# Patient Record
Sex: Male | Born: 1950 | Hispanic: No
Health system: Southern US, Community
[De-identification: ages and names within clinical notes are randomized; demographics above are authoritative.]

## PROBLEM LIST (undated history)

## (undated) DIAGNOSIS — D239 Other benign neoplasm of skin, unspecified: Secondary | ICD-10-CM

## (undated) DIAGNOSIS — C439 Malignant melanoma of skin, unspecified: Secondary | ICD-10-CM

## (undated) DIAGNOSIS — J302 Other seasonal allergic rhinitis: Secondary | ICD-10-CM

## (undated) DIAGNOSIS — M1990 Unspecified osteoarthritis, unspecified site: Secondary | ICD-10-CM

## (undated) DIAGNOSIS — G4730 Sleep apnea, unspecified: Secondary | ICD-10-CM

## (undated) DIAGNOSIS — D099 Carcinoma in situ, unspecified: Secondary | ICD-10-CM

## (undated) DIAGNOSIS — Z8489 Family history of other specified conditions: Secondary | ICD-10-CM

## (undated) DIAGNOSIS — C4492 Squamous cell carcinoma of skin, unspecified: Secondary | ICD-10-CM

## (undated) DIAGNOSIS — I10 Essential (primary) hypertension: Secondary | ICD-10-CM

## (undated) DIAGNOSIS — Z87442 Personal history of urinary calculi: Secondary | ICD-10-CM

## (undated) HISTORY — DX: Carcinoma in situ, unspecified: D09.9

---
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
## 2000-06-09 DIAGNOSIS — C439 Malignant melanoma of skin, unspecified: Secondary | ICD-10-CM

## 2000-06-09 HISTORY — DX: Malignant melanoma of skin, unspecified: C43.9

## 2003-06-10 HISTORY — PX: COLONOSCOPY: SHX174

## 2012-10-12 DIAGNOSIS — J309 Allergic rhinitis, unspecified: Secondary | ICD-10-CM | POA: Insufficient documentation

## 2015-01-09 ENCOUNTER — Telehealth: Payer: Self-pay | Admitting: Gastroenterology

## 2015-01-09 ENCOUNTER — Other Ambulatory Visit: Payer: Self-pay

## 2015-01-09 NOTE — Telephone Encounter (Signed)
Gastroenterology Pre-Procedure Review  Request Date: 02-23-2015 Requesting Physician: Dr.   PATIENT REVIEW QUESTIONS: The patient responded to the following health history questions as indicated:    1. Are you having any GI issues? no 2. Do you have a personal history of Polyps? yes (12 years ago) 3. Do you have a family history of Colon Cancer or Polyps? no 4. Diabetes Mellitus? no 5. Joint replacements in the past 12 months?no 6. Major health problems in the past 3 months?no 7. Any artificial heart valves, MVP, or defibrillator?no    MEDICATIONS & ALLERGIES:    Patient reports the following regarding taking any anticoagulation/antiplatelet therapy:   Plavix, Coumadin, Eliquis, Xarelto, Lovenox, Pradaxa, Brilinta, or Effient? no Aspirin? yes (Daily)  Patient confirms/reports the following medications:  *Daily Aspirin *Simvastatin *Losartan potassium No current outpatient prescriptions on file.   No current facility-administered medications for this visit.    Patient confirms/reports the following allergies:  Allergies not on file  No orders of the defined types were placed in this encounter.    AUTHORIZATION INFORMATION Primary Insurance: 1D#: Group #:  Secondary Insurance: 1D#: Group #:  SCHEDULE INFORMATION: Date: 02-23-2015 Time: Location:[facility]

## 2015-02-20 ENCOUNTER — Other Ambulatory Visit: Payer: Self-pay

## 2015-02-23 ENCOUNTER — Ambulatory Visit: Admission: RE | Admit: 2015-02-23 | Payer: Self-pay | Source: Ambulatory Visit | Admitting: Gastroenterology

## 2015-02-23 HISTORY — DX: Other seasonal allergic rhinitis: J30.2

## 2015-02-23 HISTORY — DX: Family history of other specified conditions: Z84.89

## 2015-02-23 HISTORY — DX: Sleep apnea, unspecified: G47.30

## 2015-02-23 HISTORY — DX: Unspecified osteoarthritis, unspecified site: M19.90

## 2015-02-23 SURGERY — COLONOSCOPY WITH PROPOFOL
Anesthesia: Choice

## 2015-02-27 ENCOUNTER — Encounter
Admission: RE | Disposition: A | Discharge status: Home / Self Care | Payer: Self-pay | Source: Ambulatory Visit | Attending: Gastroenterology

## 2015-02-27 ENCOUNTER — Ambulatory Visit
Admission: RE | Admit: 2015-02-27 | Discharge: 2015-02-27 | Disposition: A | Discharge status: Home / Self Care | Payer: 59 | Source: Ambulatory Visit | Attending: Gastroenterology | Admitting: Gastroenterology

## 2015-02-27 ENCOUNTER — Encounter: Payer: Self-pay | Admitting: Anesthesiology

## 2015-02-27 ENCOUNTER — Ambulatory Visit: Payer: 59 | Admitting: Anesthesiology

## 2015-02-27 DIAGNOSIS — K5730 Diverticulosis of large intestine without perforation or abscess without bleeding: Secondary | ICD-10-CM | POA: Insufficient documentation

## 2015-02-27 DIAGNOSIS — Z1211 Encounter for screening for malignant neoplasm of colon: Secondary | ICD-10-CM | POA: Insufficient documentation

## 2015-02-27 DIAGNOSIS — M179 Osteoarthritis of knee, unspecified: Secondary | ICD-10-CM | POA: Insufficient documentation

## 2015-02-27 DIAGNOSIS — G4730 Sleep apnea, unspecified: Secondary | ICD-10-CM | POA: Diagnosis not present

## 2015-02-27 DIAGNOSIS — K640 First degree hemorrhoids: Secondary | ICD-10-CM | POA: Diagnosis not present

## 2015-02-27 HISTORY — PX: COLONOSCOPY WITH PROPOFOL: SHX5780

## 2015-02-27 SURGERY — COLONOSCOPY WITH PROPOFOL
Anesthesia: General

## 2015-02-27 MED ORDER — SODIUM CHLORIDE 0.9 % IV SOLN
INTRAVENOUS | Status: DC
  Administered 2015-02-27: 1000 mL via INTRAVENOUS

## 2015-02-27 MED ORDER — PROPOFOL INFUSION 10 MG/ML OPTIME
INTRAVENOUS | Status: DC | PRN
  Administered 2015-02-27: 140 ug/kg/min via INTRAVENOUS

## 2015-02-27 MED ORDER — MIDAZOLAM HCL 2 MG/2ML IJ SOLN
INTRAMUSCULAR | Status: DC | PRN
  Administered 2015-02-27: 1 mg via INTRAVENOUS

## 2015-02-27 MED ORDER — LIDOCAINE HCL (CARDIAC) 20 MG/ML IV SOLN
INTRAVENOUS | Status: DC | PRN
  Administered 2015-02-27: 60 mg via INTRAVENOUS

## 2015-02-27 MED ORDER — PROPOFOL 10 MG/ML IV BOLUS
INTRAVENOUS | Status: DC | PRN
  Administered 2015-02-27: 50 mg via INTRAVENOUS

## 2015-02-27 MED ORDER — SODIUM CHLORIDE 0.9 % IV SOLN
INTRAVENOUS | Status: DC
  Administered 2015-02-27: 10:00:00 via INTRAVENOUS

## 2015-02-27 NOTE — Transfer of Care (Signed)
Immediate Anesthesia Transfer of Care Note  Patient: Carlos Carroll  Procedure(s) Performed: Procedure(s): COLONOSCOPY WITH PROPOFOL (N/A)  Patient Location: Endoscopy Unit  Anesthesia Type:General  Level of Consciousness: awake, alert , oriented and patient cooperative  Airway & Oxygen Therapy: Patient Spontanous Breathing and Patient connected to nasal cannula oxygen  Post-op Assessment: Report given to RN, Post -op Vital signs reviewed and stable and Patient moving all extremities X 4  Post vital signs: Reviewed and stable  Last Vitals:  Filed Vitals:   02/27/15 1050  BP: 144/81  Pulse: 94  Temp: 36.4 C  Resp: 23    Complications: No apparent anesthesia complications

## 2015-02-27 NOTE — Anesthesia Postprocedure Evaluation (Signed)
  Anesthesia Post-op Note  Patient: Carlos Carroll  Procedure(s) Performed: Procedure(s): COLONOSCOPY WITH PROPOFOL (N/A)  Anesthesia type:General  Patient location: PACU  Post pain: Pain level controlled  Post assessment: Post-op Vital signs reviewed, Patient's Cardiovascular Status Stable, Respiratory Function Stable, Patent Airway and No signs of Nausea or vomiting  Post vital signs: Reviewed and stable  Last Vitals:  Filed Vitals:   02/27/15 1120  BP: 155/94  Pulse: 81  Temp:   Resp: 16    Level of consciousness: awake, alert  and patient cooperative  Complications: No apparent anesthesia complications

## 2015-02-27 NOTE — Anesthesia Preprocedure Evaluation (Signed)
Anesthesia Evaluation  Patient identified by MRN, date of birth, ID band Patient awake    Reviewed: Allergy & Precautions, NPO status , Patient's Chart, lab work & pertinent test results, reviewed documented beta blocker date and time   Airway Mallampati: III  TM Distance: >3 FB     Dental  (+) Chipped   Pulmonary sleep apnea and Continuous Positive Airway Pressure Ventilation ,           Cardiovascular      Neuro/Psych    GI/Hepatic   Endo/Other  Morbid obesity  Renal/GU      Musculoskeletal  (+) Arthritis ,   Abdominal   Peds  Hematology   Anesthesia Other Findings   Reproductive/Obstetrics                             Anesthesia Physical Anesthesia Plan  ASA: III  Anesthesia Plan: General   Post-op Pain Management:    Induction:   Airway Management Planned: Nasal Cannula  Additional Equipment:   Intra-op Plan:   Post-operative Plan:   Informed Consent: I have reviewed the patients History and Physical, chart, labs and discussed the procedure including the risks, benefits and alternatives for the proposed anesthesia with the patient or authorized representative who has indicated his/her understanding and acceptance.     Plan Discussed with: CRNA  Anesthesia Plan Comments:         Anesthesia Quick Evaluation

## 2015-02-27 NOTE — Op Note (Signed)
[facility] Gastroenterology Patient Name: Carlos Carroll Procedure Date: 02/27/2015 10:05 AM MRN: [id number] Account #: [id number] Date of Birth: [birth year] Admit Type: Outpatient Age: 64 Room: [facility] ENDO ROOM 4 Gender: Male Note Status: Finalized Procedure:         Colonoscopy Indications:       Screening for colorectal malignant neoplasm Providers:         Lucilla Lame, MD Referring MD:      Gayland Curry, MD (Referring MD) Medicines:         Propofol per Anesthesia Complications:     No immediate complications. Procedure:         Pre-Anesthesia Assessment:                    - Prior to the procedure, a History and Physical was                     performed, and patient medications and allergies were                     reviewed. The patient's tolerance of previous anesthesia                     was also reviewed. The risks and benefits of the procedure                     and the sedation options and risks were discussed with the                     patient. All questions were answered, and informed consent                     was obtained. Prior Anticoagulants: The patient has taken                     no previous anticoagulant or antiplatelet agents. ASA                     Grade Assessment: II - A patient with mild systemic                     disease. After reviewing the risks and benefits, the                     patient was deemed in satisfactory condition to undergo                     the procedure.                    After obtaining informed consent, the colonoscope was                     passed under direct vision. Throughout the procedure, the                     patient's blood pressure, pulse, and oxygen saturations                     were monitored continuously. The Colonoscope was                     introduced through the anus and advanced to the the cecum,  identified by appendiceal orifice and ileocecal valve. The               colonoscopy was performed without difficulty. The patient                     tolerated the procedure well. The quality of the bowel                     preparation was excellent. Findings:      The perianal and digital rectal examinations were normal.      Multiple small-mouthed diverticula were found in the sigmoid colon.      Non-bleeding internal hemorrhoids were found during retroflexion. The       hemorrhoids were Grade I (internal hemorrhoids that do not prolapse). Impression:        - Diverticulosis in the sigmoid colon.                    - Non-bleeding internal hemorrhoids.                    - No specimens collected. Recommendation:    - Repeat colonoscopy in 10 years for screening unless any                     change in family history or lower GI problems. Procedure Code(s): --- Professional ---                    [phone], Colonoscopy, flexible; diagnostic, including                     collection of specimen(s) by brushing or washing, when                     performed (separate procedure) Diagnosis Code(s): --- Professional ---                    Z12.11, Encounter for screening for malignant neoplasm of                     colon                    K57.30, Diverticulosis of large intestine without                     perforation or abscess without bleeding CPT copyright 2014 American Medical Association. All rights reserved. The codes documented in this report are preliminary and upon coder review may  be revised to meet current compliance requirements. Lucilla Lame, MD 02/27/2015 10:44:04 AM This report has been signed electronically. Number of Addenda: 0 Note Initiated On: 02/27/2015 10:05 AM Scope Withdrawal Time: 0 hours 7 minutes 29 seconds  Total Procedure Duration: 0 hours 12 minutes 54 seconds       [facility]

## 2015-02-27 NOTE — H&P (Signed)
  [facility]  [address], [place], [place] [address] Phone: [phone] Fax : [phone]  Primary Care Physician:  Gayland Curry, MD Primary Gastroenterologist:  Dr. Allen Norris  Pre-Procedure History & Physical: HPI:  Carlos Carroll is a 64 y.o. male is here for a screening colonoscopy.   Past Medical History  Diagnosis Date  . Arthritis     KNEE JOINT  . Seasonal allergies   . Sleep apnea     CPAP  . Family history of adverse reaction to anesthesia     MOTHER LOST TASTE AFTER SURGERY    Past Surgical History  Procedure Laterality Date  . Colonoscopy  2005    Prior to Admission medications   Medication Sig Start Date End Date Taking? Authorizing Provider  aspirin 81 MG tablet Take 81 mg by mouth daily. AM   Yes Historical Provider, MD  Flaxseed, Linseed, (FLAX SEED OIL PO) Take 1,400 mg by mouth. AM   Yes Historical Provider, MD  Glucosamine-Chondroit-Vit C-Mn (GLUCOSAMINE 1500 COMPLEX PO) Take by mouth. AM   Yes Historical Provider, MD  losartan (COZAAR) 50 MG tablet Take 50 mg by mouth daily. AM   Yes Historical Provider, MD  Multiple Vitamins-Minerals (MULTIVITAMIN ADULT PO) Take by mouth. AM   Yes Historical Provider, MD  simvastatin (ZOCOR) 20 MG tablet Take 20 mg by mouth daily. AM   Yes Historical Provider, MD    Allergies as of 02/20/2015  . (No Known Allergies)    History reviewed. No pertinent family history.  Social History   Social History  . Marital Status: Married    Spouse Name: N/A  . Number of Children: N/A  . Years of Education: N/A   Occupational History  . Not on file.   Social History Main Topics  . Smoking status: Never Smoker   . Smokeless tobacco: Not on file  . Alcohol Use: No  . Drug Use: Not on file  . Sexual Activity: Not on file   Other Topics Concern  . Not on file   Social History Narrative    Review of Systems: See HPI, otherwise negative ROS  Physical Exam: BP 197/82 mmHg  Pulse 90   Temp(Src) 96.7 F (35.9 C) (Tympanic)  Resp 21  Ht 5\' 10"  (1.778 m)  Wt 375 lb (170.099 kg)  BMI 53.81 kg/m2  SpO2 98% General:   Alert,  pleasant and cooperative in NAD Head:  Normocephalic and atraumatic. Neck:  Supple; no masses or thyromegaly. Lungs:  Clear throughout to auscultation.    Heart:  Regular rate and rhythm. Abdomen:  Soft, nontender and nondistended. Normal bowel sounds, without guarding, and without rebound.   Neurologic:  Alert and  oriented x4;  grossly normal neurologically.  Impression/Plan: Carlos Carroll is now here to undergo a screening colonoscopy.  Risks, benefits, and alternatives regarding colonoscopy have been reviewed with the patient.  Questions have been answered.  All parties agreeable.

## 2015-02-28 ENCOUNTER — Encounter: Payer: Self-pay | Admitting: Gastroenterology

## 2016-03-26 DIAGNOSIS — Z6841 Body Mass Index (BMI) 40.0 and over, adult: Secondary | ICD-10-CM | POA: Insufficient documentation

## 2016-06-18 DIAGNOSIS — Z86018 Personal history of other benign neoplasm: Secondary | ICD-10-CM

## 2016-06-18 HISTORY — DX: Personal history of other benign neoplasm: Z86.018

## 2016-09-02 DIAGNOSIS — C4492 Squamous cell carcinoma of skin, unspecified: Secondary | ICD-10-CM

## 2016-09-02 HISTORY — DX: Squamous cell carcinoma of skin, unspecified: C44.92

## 2017-09-03 DIAGNOSIS — D239 Other benign neoplasm of skin, unspecified: Secondary | ICD-10-CM

## 2017-09-03 HISTORY — DX: Other benign neoplasm of skin, unspecified: D23.9

## 2017-11-06 ENCOUNTER — Ambulatory Visit (INDEPENDENT_AMBULATORY_CARE_PROVIDER_SITE_OTHER): Payer: Medicare PPO

## 2017-11-06 ENCOUNTER — Other Ambulatory Visit: Payer: Self-pay | Admitting: Podiatry

## 2017-11-06 ENCOUNTER — Ambulatory Visit: Payer: Medicare PPO | Admitting: Podiatry

## 2017-11-06 DIAGNOSIS — M659 Synovitis and tenosynovitis, unspecified: Secondary | ICD-10-CM

## 2017-11-06 DIAGNOSIS — M65971 Unspecified synovitis and tenosynovitis, right ankle and foot: Secondary | ICD-10-CM

## 2017-11-06 DIAGNOSIS — M779 Enthesopathy, unspecified: Secondary | ICD-10-CM

## 2017-11-06 MED ORDER — MELOXICAM 15 MG PO TABS: 15.0000 mg | ORAL_TABLET | Freq: Every day | ORAL | 3 refills | Status: DC

## 2017-11-09 NOTE — Progress Notes (Signed)
   Subjective:  67 year old male presenting today as a new patient with a chief complaint of intermittent sharp, stabbing pain of the medial right ankle that began about three months ago. Walking, bearing weight and elevating the foot increases the pain. He has been taking Tylenol and Ibuprofen for the pain with minimal relief. Patient is here for further evaluation and treatment.    Past Medical History:  Diagnosis Date  . Arthritis    KNEE JOINT  . Family history of adverse reaction to anesthesia    MOTHER LOST TASTE AFTER SURGERY  . Seasonal allergies   . Sleep apnea    CPAP       Objective / Physical Exam:  General:  The patient is alert and oriented x3 in no acute distress. Dermatology:  Skin is warm, dry and supple bilateral lower extremities. Negative for open lesions or macerations. Vascular:  Palpable pedal pulses bilaterally. No edema or erythema noted. Capillary refill within normal limits. Neurological:  Epicritic and protective threshold grossly intact bilaterally.  Musculoskeletal Exam:  Pain on palpation to the anterior lateral medial aspects of the patient's right ankle. Mild edema noted. Range of motion within normal limits to all pedal and ankle joints bilateral. Muscle strength 5/5 in all groups bilateral.   Radiographic Exam:  DJD noted to the posterior aspect of the ankle joint on the lateral view. No fracture/dislocation/boney destruction.    Assessment: 1. pain in right ankle 2. Synovitis/DJD of right ankle  Plan of Care:  1. Patient was evaluated. X-Rays reviewed.  2. injection of 0.5 mL Celestone Soluspan injected in the patient's right ankle. 3. prescription for Meloxicam provided to patient.  4. ankle brace dispensed 5. patient is to return to clinic in 4 weeks  Edrick Kins, DPM [facility]  Dr. Edrick Kins, [address]                                        [place], [place] [address]                Office  [phone]  Fax [phone]

## 2018-01-01 ENCOUNTER — Ambulatory Visit: Payer: Medicare PPO | Admitting: Podiatry

## 2018-01-01 ENCOUNTER — Encounter: Payer: Self-pay | Admitting: Podiatry

## 2018-01-01 DIAGNOSIS — M19071 Primary osteoarthritis, right ankle and foot: Secondary | ICD-10-CM

## 2018-01-01 DIAGNOSIS — M659 Synovitis and tenosynovitis, unspecified: Secondary | ICD-10-CM | POA: Diagnosis not present

## 2018-01-01 NOTE — Progress Notes (Signed)
   Subjective:  67 year old male presenting today for follow up evaluation of right ankle pain. He states his pain has not changed and the injection did not provide any relief. He reports taking Meloxicam sporadically with no significant relief but does admit that he does not take it daily as directed. Patient is here for further evaluation and treatment.    Past Medical History:  Diagnosis Date  . Arthritis    KNEE JOINT  . Family history of adverse reaction to anesthesia    MOTHER LOST TASTE AFTER SURGERY  . Seasonal allergies   . Sleep apnea    CPAP       Objective / Physical Exam:  General:  The patient is alert and oriented x3 in no acute distress. Dermatology:  Skin is warm, dry and supple bilateral lower extremities. Negative for open lesions or macerations. Vascular:  Palpable pedal pulses bilaterally. No edema or erythema noted. Capillary refill within normal limits. Neurological:  Epicritic and protective threshold grossly intact bilaterally.  Musculoskeletal Exam:  Pain on palpation to the anterior lateral medial aspects of the patient's right ankle. Mild edema noted. Pain with plantar flexion of the ankle joint. Range of motion within normal limits to all pedal and ankle joints bilateral. Muscle strength 5/5 in all groups bilateral.   Assessment: 1. pain in right ankle 2. Synovitis/DJD of right ankle  Plan of Care:  1. Patient was evaluated.   2. injection of 0.5 mL Celestone Soluspan injected into the posterior aspect of the ankle. 3. Continue taking Tylenol PM and OTC Motrin.  4. Continue using ankle brace.  5. Return to clinic as needed.   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  Edrick Kins, DPM [facility]  Dr. Edrick Kins, [address]                                        [place], [place] [address]                Office [phone]  Fax [phone]

## 2018-03-01 DIAGNOSIS — R899 Unspecified abnormal finding in specimens from other organs, systems and tissues: Secondary | ICD-10-CM | POA: Insufficient documentation

## 2018-05-10 DIAGNOSIS — M1711 Unilateral primary osteoarthritis, right knee: Secondary | ICD-10-CM | POA: Insufficient documentation

## 2018-06-22 ENCOUNTER — Other Ambulatory Visit: Payer: Self-pay | Admitting: Podiatry

## 2019-01-18 ENCOUNTER — Other Ambulatory Visit: Payer: Self-pay

## 2019-01-18 ENCOUNTER — Emergency Department: Payer: Medicare HMO

## 2019-01-18 ENCOUNTER — Emergency Department
Admission: EM | Admit: 2019-01-18 | Discharge: 2019-01-18 | Disposition: A | Discharge status: Home / Self Care | Payer: Medicare HMO | Attending: Student in an Organized Health Care Education/Training Program | Admitting: Student in an Organized Health Care Education/Training Program

## 2019-01-18 ENCOUNTER — Encounter: Payer: Self-pay | Admitting: Emergency Medicine

## 2019-01-18 DIAGNOSIS — Z7982 Long term (current) use of aspirin: Secondary | ICD-10-CM | POA: Insufficient documentation

## 2019-01-18 DIAGNOSIS — Z79899 Other long term (current) drug therapy: Secondary | ICD-10-CM | POA: Diagnosis not present

## 2019-01-18 DIAGNOSIS — N201 Calculus of ureter: Secondary | ICD-10-CM | POA: Insufficient documentation

## 2019-01-18 DIAGNOSIS — R109 Unspecified abdominal pain: Secondary | ICD-10-CM

## 2019-01-18 LAB — CBC WITH DIFFERENTIAL/PLATELET
Abs Immature Granulocytes: 0.11 10*3/uL — ABNORMAL HIGH (ref 0.00–0.07)
Basophils Absolute: 0.1 10*3/uL (ref 0.0–0.1)
Basophils Relative: 0 %
Eosinophils Absolute: 0.1 10*3/uL (ref 0.0–0.5)
Eosinophils Relative: 1 %
HCT: 50.9 % (ref 39.0–52.0)
Hemoglobin: 16.3 g/dL (ref 13.0–17.0)
Immature Granulocytes: 1 %
Lymphocytes Relative: 19 %
Lymphs Abs: 2.1 10*3/uL (ref 0.7–4.0)
MCH: 28.3 pg (ref 26.0–34.0)
MCHC: 32 g/dL (ref 30.0–36.0)
MCV: 88.5 fL (ref 80.0–100.0)
Monocytes Absolute: 0.8 10*3/uL (ref 0.1–1.0)
Monocytes Relative: 8 %
Neutro Abs: 8 10*3/uL — ABNORMAL HIGH (ref 1.7–7.7)
Neutrophils Relative %: 71 %
Platelets: 292 10*3/uL (ref 150–400)
RBC: 5.75 MIL/uL (ref 4.22–5.81)
RDW: 15.6 % — ABNORMAL HIGH (ref 11.5–15.5)
WBC: 11.2 10*3/uL — ABNORMAL HIGH (ref 4.0–10.5)
nRBC: 0 % (ref 0.0–0.2)

## 2019-01-18 LAB — URINALYSIS, COMPLETE (UACMP) WITH MICROSCOPIC
Bacteria, UA: NONE SEEN
Bilirubin Urine: NEGATIVE
Glucose, UA: NEGATIVE mg/dL
Ketones, ur: NEGATIVE mg/dL
Leukocytes,Ua: NEGATIVE
Nitrite: NEGATIVE
Protein, ur: NEGATIVE mg/dL
Specific Gravity, Urine: 1.024 (ref 1.005–1.030)
pH: 5 (ref 5.0–8.0)

## 2019-01-18 LAB — COMPREHENSIVE METABOLIC PANEL
ALT: 24 U/L (ref 0–44)
AST: 24 U/L (ref 15–41)
Albumin: 4.3 g/dL (ref 3.5–5.0)
Alkaline Phosphatase: 61 U/L (ref 38–126)
Anion gap: 11 (ref 5–15)
BUN: 22 mg/dL (ref 8–23)
CO2: 26 mmol/L (ref 22–32)
Calcium: 9.9 mg/dL (ref 8.9–10.3)
Chloride: 103 mmol/L (ref 98–111)
Creatinine, Ser: 0.86 mg/dL (ref 0.61–1.24)
GFR calc Af Amer: >60 mL/min (ref >60)
GFR calc non Af Amer: >60 mL/min (ref >60)
Glucose, Bld: 107 mg/dL — ABNORMAL HIGH (ref 70–99)
Potassium: 3.8 mmol/L (ref 3.5–5.1)
Sodium: 140 mmol/L (ref 135–145)
Total Bilirubin: 0.8 mg/dL (ref 0.3–1.2)
Total Protein: 7.4 g/dL (ref 6.5–8.1)

## 2019-01-18 MED ORDER — TAMSULOSIN HCL 0.4 MG PO CAPS: 0.4000 mg | ORAL_CAPSULE | Freq: Every day | ORAL | 0 refills | Status: DC

## 2019-01-18 MED ORDER — ONDANSETRON HCL 4 MG/2ML IJ SOLN
4.0000 mg | Freq: Once | INTRAMUSCULAR | Status: AC
  Administered 2019-01-18: 4 mg via INTRAVENOUS
  Filled 2019-01-18: qty 2

## 2019-01-18 MED ORDER — ONDANSETRON HCL 4 MG PO TABS: 4.0000 mg | ORAL_TABLET | Freq: Every day | ORAL | 0 refills | Status: DC | PRN

## 2019-01-18 MED ORDER — MORPHINE SULFATE (PF) 4 MG/ML IV SOLN
4.0000 mg | INTRAVENOUS | Status: DC | PRN
  Administered 2019-01-18: 4 mg via INTRAVENOUS
  Filled 2019-01-18: qty 1

## 2019-01-18 MED ORDER — HYDROCODONE-ACETAMINOPHEN 5-325 MG PO TABS: 1.0000 | ORAL_TABLET | ORAL | 0 refills | Status: DC | PRN

## 2019-01-18 NOTE — ED Notes (Signed)
Signature pad not working and pt refused to wait for this nurse to print copy for signature

## 2019-01-18 NOTE — ED Triage Notes (Signed)
Pt presents to ED via POV with c/o intermittent L flank pain x 3 days, pt states today pain since approx 0500. Pt states hematuria on Thursday and Friday, denies obviously blood in urine since then.

## 2019-01-18 NOTE — ED Notes (Signed)
Patient made aware he cannot drive after receiving IV morphine. Patient stated "I do not feel any different" Patient made aware he needs to call a ride and patient verbalized agreement. Patient calling ride in room at this time

## 2019-01-18 NOTE — ED Provider Notes (Signed)
[facility] Emergency Department Provider Note    First MD Initiated Contact with Patient 01/18/19 [phone]     (approximate)  I have reviewed the triage vital signs and the nursing notes.   HISTORY  Chief Complaint Flank Pain    HPI Carlos Carroll is a 68 y.o. male presents the ER for evaluation of acute  left leg pain.  States he had intermittent episodes of left flank pain as well as hematuria since the weekend.  Denies any history of kidney stones.  No fevers.  No nausea or vomiting.  Is having difficulty finding a comfortable spot.  Denies any trauma.  He does take an aspirin.  History of diverticulosis.  No diarrhea or constipation.   Past Medical History:  Diagnosis Date  . Arthritis    KNEE JOINT  . Family history of adverse reaction to anesthesia    MOTHER LOST TASTE AFTER SURGERY  . Seasonal allergies   . Sleep apnea    CPAP   No family history on file. Past Surgical History:  Procedure Laterality Date  . COLONOSCOPY  2005  . COLONOSCOPY WITH PROPOFOL N/A 02/27/2015   Procedure: COLONOSCOPY WITH PROPOFOL;  Surgeon: Lucilla Lame, MD;  Location: [facility] ENDOSCOPY;  Service: Endoscopy;  Laterality: N/A;   Patient Active Problem List   Diagnosis Date Noted  . Special screening for malignant neoplasms, colon   . Diverticulosis of large intestine without diverticulitis       Prior to Admission medications   Medication Sig Start Date End Date Taking? Authorizing Provider  aspirin 81 MG tablet Take 81 mg by mouth daily. AM    [provider]  atorvastatin (LIPITOR) 20 MG tablet  12/30/17   [provider]  atorvastatin (LIPITOR) 40 MG tablet Take 40 mg by mouth daily. 08/31/17   [provider]  doxycycline (VIBRAMYCIN) 100 MG capsule TAKE 1 CAPSULE BY MOUTH TWICE A DAY TAKE WITH FOOD AND DRINK 12/22/17   [provider]  Flaxseed, Linseed, (FLAX SEED OIL PO) Take 1,400 mg by mouth. AM    [provider]   Glucosamine-Chondroit-Vit C-Mn (GLUCOSAMINE 1500 COMPLEX PO) Take by mouth. AM    [provider]  hydrochlorothiazide (HYDRODIURIL) 25 MG tablet Take by mouth. 12/29/16 12/29/17  [provider]  HYDROcodone-acetaminophen (NORCO) 5-325 MG tablet Take 1 tablet by mouth every 4 (four) hours as needed for moderate pain. 01/18/19   Merlyn Lot, MD  losartan (COZAAR) 50 MG tablet Take 50 mg by mouth daily. AM    [provider]  meloxicam (MOBIC) 15 MG tablet TAKE 1 TABLET BY MOUTH EVERY DAY 06/23/18   Edrick Kins, DPM  Multiple Vitamins-Minerals (MULTIVITAMIN ADULT PO) Take by mouth. AM    [provider]  ondansetron (ZOFRAN) 4 MG tablet Take 1 tablet (4 mg total) by mouth daily as needed. 01/18/19 01/18/20  Merlyn Lot, MD  tamsulosin (FLOMAX) 0.4 MG CAPS capsule Take 1 capsule (0.4 mg total) by mouth daily after supper. 01/18/19   Merlyn Lot, MD    Allergies Patient has no known allergies.    Social History Social History   Tobacco Use  . Smoking status: Never Smoker  . Smokeless tobacco: Never Used  Substance Use Topics  . Alcohol use: No  . Drug use: Not on file    Review of Systems Patient denies headaches, rhinorrhea, blurry vision, numbness, shortness of breath, chest pain, edema, cough, abdominal pain, nausea, vomiting, diarrhea, dysuria, fevers, rashes or hallucinations  unless otherwise stated above in HPI. ____________________________________________   PHYSICAL EXAM:  VITAL SIGNS: Vitals:   01/18/19 0727 01/18/19 0916  BP: (!) 162/67 (!) 144/99  Pulse: 84 79  Resp: 20 18  Temp: 97.9 F (36.6 C)   SpO2: 95% 94%    Constitutional: Alert and oriented.  Eyes: Conjunctivae are normal.  Head: Atraumatic. Nose: No congestion/rhinnorhea. Mouth/Throat: Mucous membranes are moist.   Neck: No stridor. Painless ROM.  Cardiovascular: Normal rate, regular rhythm. Grossly normal heart sounds.  Good peripheral circulation.  Respiratory: Normal respiratory effort.  No retractions. Lungs CTAB. Gastrointestinal: Soft and nontender. No distention. No abdominal bruits. + left CVA tenderness. Genitourinary: deferred Musculoskeletal: No lower extremity tenderness nor edema.  No joint effusions. Neurologic:  Normal speech and language. No gross focal neurologic deficits are appreciated. No facial droop Skin:  Skin is warm, dry and intact. No rash noted. Psychiatric: Mood and affect are normal. Speech and behavior are normal.  ____________________________________________   LABS (all labs ordered are listed, but only abnormal results are displayed)  Results for orders placed or performed during the hospital encounter of 01/18/19 (from the past 24 hour(s))  CBC with Differential/Platelet     Status: Abnormal   Collection Time: 01/18/19  7:51 AM  Result Value Ref Range   WBC 11.2 (H) 4.0 - 10.5 K/uL   RBC 5.75 4.22 - 5.81 MIL/uL   Hemoglobin 16.3 13.0 - 17.0 g/dL   HCT 50.9 39.0 - 52.0 %   MCV 88.5 80.0 - 100.0 fL   MCH 28.3 26.0 - 34.0 pg   MCHC 32.0 30.0 - 36.0 g/dL   RDW 15.6 (H) 11.5 - 15.5 %   Platelets 292 150 - 400 K/uL   nRBC 0.0 0.0 - 0.2 %   Neutrophils Relative % 71 %   Neutro Abs 8.0 (H) 1.7 - 7.7 K/uL   Lymphocytes Relative 19 %   Lymphs Abs 2.1 0.7 - 4.0 K/uL   Monocytes Relative 8 %   Monocytes Absolute 0.8 0.1 - 1.0 K/uL   Eosinophils Relative 1 %   Eosinophils Absolute 0.1 0.0 - 0.5 K/uL   Basophils Relative 0 %   Basophils Absolute 0.1 0.0 - 0.1 K/uL   Immature Granulocytes 1 %   Abs Immature Granulocytes 0.11 (H) 0.00 - 0.07 K/uL  Comprehensive metabolic panel     Status: Abnormal   Collection Time: 01/18/19  7:51 AM  Result Value Ref Range   Sodium 140 135 - 145 mmol/L   Potassium 3.8 3.5 - 5.1 mmol/L   Chloride 103 98 - 111 mmol/L   CO2 26 22 - 32 mmol/L   Glucose, Bld 107 (H) 70 - 99 mg/dL   BUN 22 8 - 23 mg/dL   Creatinine, Ser 0.86 0.61 - 1.24 mg/dL   Calcium 9.9 8.9 - 10.3  mg/dL   Total Protein 7.4 6.5 - 8.1 g/dL   Albumin 4.3 3.5 - 5.0 g/dL   AST 24 15 - 41 U/L   ALT 24 0 - 44 U/L   Alkaline Phosphatase 61 38 - 126 U/L   Total Bilirubin 0.8 0.3 - 1.2 mg/dL   GFR calc non Af Amer >60 >60 mL/min   GFR calc Af Amer >60 >60 mL/min   Anion gap 11 5 - 15  Urinalysis, Complete w Microscopic     Status: Abnormal   Collection Time: 01/18/19  7:51 AM  Result Value Ref Range   Color, Urine YELLOW (A) YELLOW   APPearance  HAZY (A) CLEAR   Specific Gravity, Urine 1.024 1.005 - 1.030   pH 5.0 5.0 - 8.0   Glucose, UA NEGATIVE NEGATIVE mg/dL   Hgb urine dipstick SMALL (A) NEGATIVE   Bilirubin Urine NEGATIVE NEGATIVE   Ketones, ur NEGATIVE NEGATIVE mg/dL   Protein, ur NEGATIVE NEGATIVE mg/dL   Nitrite NEGATIVE NEGATIVE   Leukocytes,Ua NEGATIVE NEGATIVE   RBC / HPF 21-50 0 - 5 RBC/hpf   WBC, UA 6-10 0 - 5 WBC/hpf   Bacteria, UA NONE SEEN NONE SEEN   Squamous Epithelial / LPF 0-5 0 - 5   Mucus PRESENT    ____________________________________________ ____________________________________________  RADIOLOGY  I personally reviewed all radiographic images ordered to evaluate for the above acute complaints and reviewed radiology reports and findings.  These findings were personally discussed with the patient.  Please see medical record for radiology report.  ____________________________________________   PROCEDURES  Procedure(s) performed:  Procedures    Critical Care performed: no ____________________________________________   INITIAL IMPRESSION / ASSESSMENT AND PLAN / ED COURSE  Pertinent labs & imaging results that were available during my care of the patient were reviewed by me and considered in my medical decision making (see chart for details).   DDX: stone, pyelo, AAA, cystitis, colitis  Carlos Carroll is a 68 y.o. who presents to the ED with symptoms as described above.  Clinically seems consistent with stone.  Ordered CT imaging and urinalysis  will be ordered for above differential.  Will order pain medication and reassess.  Clinical Course as of Jan 18 1412  Tue Jan 18, 2019  0853 Patient is currently pain-free.  Discussed results of CT imaging and blood work with patient and I also consulted Dr. Bernardo Heater of urology.  Will hold off on Toradol right now as patient will be candidate for lithotripsy this week.  Will get into urology clinic for further management.  We discussed signs and symptoms for which he should return to the ER.   [PR]    Clinical Course User Index [PR] Merlyn Lot, MD    The patient was evaluated in Emergency Department today for the symptoms described in the history of present illness. He/she was evaluated in the context of the global COVID-19 pandemic, which necessitated consideration that the patient might be at risk for infection with the SARS-CoV-2 virus that causes COVID-19. Institutional protocols and algorithms that pertain to the evaluation of patients at risk for COVID-19 are in a state of rapid change based on information released by regulatory bodies including the CDC and federal and state organizations. These policies and algorithms were followed during the patient's care in the ED.  As part of my medical decision making, I reviewed the following data within the [facility] notes reviewed and incorporated, Labs reviewed, notes from prior ED visits and [place] Controlled Substance Database   ____________________________________________   FINAL CLINICAL IMPRESSION(S) / ED DIAGNOSES  Final diagnoses:  Left flank pain  Ureterolithiasis      NEW MEDICATIONS STARTED DURING THIS VISIT:  Discharge Medication List as of 01/18/2019  8:53 AM    START taking these medications   Details  HYDROcodone-acetaminophen (NORCO) 5-325 MG tablet Take 1 tablet by mouth every 4 (four) hours as needed for moderate pain., Starting Tue 01/18/2019, Normal    ondansetron (ZOFRAN) 4 MG tablet  Take 1 tablet (4 mg total) by mouth daily as needed., Starting Tue 01/18/2019, Until Wed 01/18/2020, Normal    tamsulosin (FLOMAX) 0.4 MG CAPS capsule  Take 1 capsule (0.4 mg total) by mouth daily after supper., Starting Tue 01/18/2019, Normal         Note:  This document was prepared using Dragon voice recognition software and may include unintentional dictation errors.    Merlyn Lot, MD 01/18/19 [phone]

## 2019-01-18 NOTE — Discharge Instructions (Signed)

## 2019-01-19 ENCOUNTER — Ambulatory Visit: Payer: Medicare HMO | Admitting: Urology

## 2019-01-19 ENCOUNTER — Other Ambulatory Visit: Payer: Self-pay

## 2019-01-19 ENCOUNTER — Other Ambulatory Visit: Payer: Self-pay | Admitting: Radiology

## 2019-01-19 ENCOUNTER — Telehealth: Payer: Self-pay | Admitting: Radiology

## 2019-01-19 ENCOUNTER — Encounter: Payer: Self-pay | Admitting: Urology

## 2019-01-19 VITALS — BP 142/67 | HR 101 | Ht 69.0 in | Wt 350.0 lb

## 2019-01-19 DIAGNOSIS — N200 Calculus of kidney: Secondary | ICD-10-CM | POA: Diagnosis not present

## 2019-01-19 DIAGNOSIS — E785 Hyperlipidemia, unspecified: Secondary | ICD-10-CM | POA: Insufficient documentation

## 2019-01-19 DIAGNOSIS — Z85820 Personal history of malignant melanoma of skin: Secondary | ICD-10-CM | POA: Insufficient documentation

## 2019-01-19 DIAGNOSIS — I10 Essential (primary) hypertension: Secondary | ICD-10-CM | POA: Insufficient documentation

## 2019-01-19 DIAGNOSIS — G4733 Obstructive sleep apnea (adult) (pediatric): Secondary | ICD-10-CM | POA: Insufficient documentation

## 2019-01-19 MED ORDER — HYDROCODONE-ACETAMINOPHEN 5-325 MG PO TABS: 1.0000 | ORAL_TABLET | Freq: Four times a day (QID) | ORAL | 0 refills | Status: DC | PRN

## 2019-01-19 NOTE — H&P (View-Only) (Signed)
01/19/19 10:37 AM   Carlos Carroll 02/04/1951 [id number]  Referring provider: Gayland Curry, MD [place],  [place] [address]  CC: Left flank pain  HPI: I saw Carlos Carroll in urology clinic today for evaluation of left-sided flank pain.  He is a 68 year old male with past medical history notable for morbid obesity and sleep apnea who reports 1 week of moderate left-sided low back pain that is sharp and worse in the mornings.  He also has noted some hematuria.  He also has had some mild nausea.  He was seen in the emergency department on 01/18/2019 for worsening pain, and CT scan at that time showed a 1.3 cm left proximal ureteral stone with upstream hydronephrosis.  Urinalysis was benign, his pain was controlled, and he was discharged home with medical expulsive therapy and urology follow-up.  He reports his pain is well controlled with NSAIDs and narcotics.  He denies any fever, chills, chest pain, or shortness of breath.  I personally reviewed the CT scan.  Left 1.3 cm UPJ stone, 750HU, 23 cm skin to stone distance.  He reports a history of spontaneously passed a kidney stone 30 years ago, but has never required surgical intervention.   PMH: Past Medical History:  Diagnosis Date  . Arthritis    KNEE JOINT  . Family history of adverse reaction to anesthesia    MOTHER LOST TASTE AFTER SURGERY  . Seasonal allergies   . Sleep apnea    CPAP    Surgical History: Past Surgical History:  Procedure Laterality Date  . COLONOSCOPY  2005  . COLONOSCOPY WITH PROPOFOL N/A 02/27/2015   Procedure: COLONOSCOPY WITH PROPOFOL;  Surgeon: Lucilla Lame, MD;  Location: [facility] ENDOSCOPY;  Service: Endoscopy;  Laterality: N/A;    Allergies: No Known Allergies  Family History: No pertinent family history  Social History:  reports that he has never smoked. He has never used smokeless tobacco. He reports that he does not drink alcohol. No history on file for drug.  ROS: Please see  flowsheet from today's date for complete review of systems.  Physical Exam: BP (!) 142/67   Pulse (!) 101   Ht 5\' 9"  (1.753 m)   Wt (!) 350 lb (158.8 kg)   BMI 51.69 kg/m    Constitutional:  Alert and oriented, No acute distress. Cardiovascular: No clubbing, cyanosis, or edema. Respiratory: Normal respiratory effort, no increased work of breathing. GI: Abdomen is soft, nontender, nondistended, no abdominal masses GU: Left CVA tenderness Lymph: No cervical or inguinal lymphadenopathy. Skin: No rashes, bruises or suspicious lesions. Neurologic: Grossly intact, no focal deficits, moving all 4 extremities. Psychiatric: Normal mood and affect.  Laboratory Data: Reviewed  Urinalysis today 0-5 WBCs, 0-2 RBCs, no bacteria, no yeast, nitrite negative  Pertinent Imaging: I personally reviewed the CT scan.  Left 1.3 cm UPJ stone, 750HU, 23 cm skin to stone distance.  Assessment & Plan:   In summary, the patient is a 68 year old male with 1 week of left-sided flank pain secondary to a 1.3 cm left UPJ calculus.  He has no clinical or laboratory signs of infection.  We discussed various treatment options for urolithiasis including observation with or without medical expulsive therapy, shockwave lithotripsy (SWL), ureteroscopy and laser lithotripsy with stent placement, and percutaneous nephrolithotomy.  We discussed that management is based on stone size, location, density, patient co-morbidities, and patient preference.   Stones <58mm in size have a >80% spontaneous passage rate. Data surrounding the use of tamsulosin for  medical expulsive therapy is controversial, but meta analyses suggests it is most efficacious for distal stones between 5-33mm in size. Possible side effects include dizziness/lightheadedness, and retrograde ejaculation.  SWL has a lower stone free rate in a single procedure, but also a lower complication rate compared to ureteroscopy and avoids a stent and associated stent  related symptoms. Possible complications include renal hematoma, steinstrasse, and need for additional treatment.  Ureteroscopy with laser lithotripsy and stent placement has a higher stone free rate than SWL in a single procedure, however increased complication rate including possible infection, ureteral injury, bleeding, and stent related morbidity. Common stent related symptoms include dysuria, urgency/frequency, and flank pain.  He is not a good candidate for shockwave lithotripsy secondary to his very large skin to stone distance.  We discussed he would likely have a poor outcome with shockwave lithotripsy, and I strongly recommended ureteroscopy with laser lithotripsy and stent placement.  Schedule left ureteroscopy, laser lithotripsy, stent placement Refill of Norco called in Send urine for culture today  Billey Co, MD  [facility] [address] [place], [place] [address] [phone]

## 2019-01-19 NOTE — H&P (View-Only) (Signed)
01/19/19 10:37 AM   [place] Modest 07/17/1950 [id number]  Referring provider: Gayland Curry, MD [place],  [place] [address]  CC: Left flank pain  HPI: I saw Mr. Carlos Carroll in urology clinic today for evaluation of left-sided flank pain.  He is a 68 year old male with past medical history notable for morbid obesity and sleep apnea who reports 1 week of moderate left-sided low back pain that is sharp and worse in the mornings.  He also has noted some hematuria.  He also has had some mild nausea.  He was seen in the emergency department on 01/18/2019 for worsening pain, and CT scan at that time showed a 1.3 cm left proximal ureteral stone with upstream hydronephrosis.  Urinalysis was benign, his pain was controlled, and he was discharged home with medical expulsive therapy and urology follow-up.  He reports his pain is well controlled with NSAIDs and narcotics.  He denies any fever, chills, chest pain, or shortness of breath.  I personally reviewed the CT scan.  Left 1.3 cm UPJ stone, 750HU, 23 cm skin to stone distance.  He reports a history of spontaneously passed a kidney stone 30 years ago, but has never required surgical intervention.   PMH: Past Medical History:  Diagnosis Date  . Arthritis    KNEE JOINT  . Family history of adverse reaction to anesthesia    MOTHER LOST TASTE AFTER SURGERY  . Seasonal allergies   . Sleep apnea    CPAP    Surgical History: Past Surgical History:  Procedure Laterality Date  . COLONOSCOPY  2005  . COLONOSCOPY WITH PROPOFOL N/A 02/27/2015   Procedure: COLONOSCOPY WITH PROPOFOL;  Surgeon: Lucilla Lame, MD;  Location: [facility] ENDOSCOPY;  Service: Endoscopy;  Laterality: N/A;    Allergies: No Known Allergies  Family History: No pertinent family history  Social History:  reports that he has never smoked. He has never used smokeless tobacco. He reports that he does not drink alcohol. No history on file for drug.  ROS: Please see  flowsheet from today's date for complete review of systems.  Physical Exam: BP (!) 142/67   Pulse (!) 101   Ht 5\' 9"  (1.753 m)   Wt (!) 350 lb (158.8 kg)   BMI 51.69 kg/m    Constitutional:  Alert and oriented, No acute distress. Cardiovascular: No clubbing, cyanosis, or edema. Respiratory: Normal respiratory effort, no increased work of breathing. GI: Abdomen is soft, nontender, nondistended, no abdominal masses GU: Left CVA tenderness Lymph: No cervical or inguinal lymphadenopathy. Skin: No rashes, bruises or suspicious lesions. Neurologic: Grossly intact, no focal deficits, moving all 4 extremities. Psychiatric: Normal mood and affect.  Laboratory Data: Reviewed  Urinalysis today 0-5 WBCs, 0-2 RBCs, no bacteria, no yeast, nitrite negative  Pertinent Imaging: I personally reviewed the CT scan.  Left 1.3 cm UPJ stone, 750HU, 23 cm skin to stone distance.  Assessment & Plan:   In summary, the patient is a 68 year old male with 1 week of left-sided flank pain secondary to a 1.3 cm left UPJ calculus.  He has no clinical or laboratory signs of infection.  We discussed various treatment options for urolithiasis including observation with or without medical expulsive therapy, shockwave lithotripsy (SWL), ureteroscopy and laser lithotripsy with stent placement, and percutaneous nephrolithotomy.  We discussed that management is based on stone size, location, density, patient co-morbidities, and patient preference.   Stones <79mm in size have a >80% spontaneous passage rate. Data surrounding the use of tamsulosin for  medical expulsive therapy is controversial, but meta analyses suggests it is most efficacious for distal stones between 5-48mm in size. Possible side effects include dizziness/lightheadedness, and retrograde ejaculation.  SWL has a lower stone free rate in a single procedure, but also a lower complication rate compared to ureteroscopy and avoids a stent and associated stent  related symptoms. Possible complications include renal hematoma, steinstrasse, and need for additional treatment.  Ureteroscopy with laser lithotripsy and stent placement has a higher stone free rate than SWL in a single procedure, however increased complication rate including possible infection, ureteral injury, bleeding, and stent related morbidity. Common stent related symptoms include dysuria, urgency/frequency, and flank pain.  He is not a good candidate for shockwave lithotripsy secondary to his very large skin to stone distance.  We discussed he would likely have a poor outcome with shockwave lithotripsy, and I strongly recommended ureteroscopy with laser lithotripsy and stent placement.  Schedule left ureteroscopy, laser lithotripsy, stent placement Refill of Norco called in Send urine for culture today  Billey Co, MD  [facility] [address], [place], [place] [address] [phone]

## 2019-01-19 NOTE — Telephone Encounter (Signed)
Discussed the [facility] Information form below with patient over the phone.    [place] [place], [place] [address] Telephone: [phone] Fax: [phone]   Thank you for choosing [facility] for your upcoming surgery!  We are always here to assist in your urological needs.  Please read the following information with specific details for your upcoming appointments related to your surgery. Please contact Vena Bassinger at [phone] Option 3 with any questions.  The Name of Your Surgery: Left ureteroscopy, laser lithotripsy, stone removal, ureteral stent placement Your Surgery Date: 01/24/2019 Your Surgeon: Hollice Espy  Please call Same Day Surgery at [phone] between the hours of 1pm-3pm one day prior to your surgery. They will inform you of the time to arrive at Same Day Surgery which is located on the second floor of the [facility].   Please refer to the attached letter regarding instructions for Pre-Admission Testing. You will receive a call from the [place] office regarding your appointment with them.  The Pre-Admission Testing office is located at [place], on the first floor of the [place] in [place] (office is to the right as you enter through the [employer] of the [facility]). Please have all medications you are currently taking and your insurance card available.  A COVID-19 test will be required prior to surgery and once test is performed you will need to remain in quarantine until the day of surgery. Patient was advised to have nothing to eat or drink after midnight the night prior to surgery except that he may have only water until 2 hours before surgery with nothing to drink within 2 hours of surgery.  The patient states he currently takes aspirin 81mg  & was informed to  continue medication per Dr Diamantina Providence. Patient's questions were answered and he expressed understanding of these instructions.

## 2019-01-19 NOTE — Progress Notes (Signed)
01/19/19 10:37 AM   Orion Modest Apr 23, 1951 [id number]  Referring provider: Gayland Curry, MD [place],  [place] [address]  CC: Left flank pain  HPI: I saw Mr. Koskinen in urology clinic today for evaluation of left-sided flank pain.  He is a 68 year old male with past medical history notable for morbid obesity and sleep apnea who reports 1 week of moderate left-sided low back pain that is sharp and worse in the mornings.  He also has noted some hematuria.  He also has had some mild nausea.  He was seen in the emergency department on 01/18/2019 for worsening pain, and CT scan at that time showed a 1.3 cm left proximal ureteral stone with upstream hydronephrosis.  Urinalysis was benign, his pain was controlled, and he was discharged home with medical expulsive therapy and urology follow-up.  He reports his pain is well controlled with NSAIDs and narcotics.  He denies any fever, chills, chest pain, or shortness of breath.  I personally reviewed the CT scan.  Left 1.3 cm UPJ stone, 750HU, 23 cm skin to stone distance.  He reports a history of spontaneously passed a kidney stone 30 years ago, but has never required surgical intervention.   PMH: Past Medical History:  Diagnosis Date  . Arthritis    KNEE JOINT  . Family history of adverse reaction to anesthesia    MOTHER LOST TASTE AFTER SURGERY  . Seasonal allergies   . Sleep apnea    CPAP    Surgical History: Past Surgical History:  Procedure Laterality Date  . COLONOSCOPY  2005  . COLONOSCOPY WITH PROPOFOL N/A 02/27/2015   Procedure: COLONOSCOPY WITH PROPOFOL;  Surgeon: Lucilla Lame, MD;  Location: [facility] ENDOSCOPY;  Service: Endoscopy;  Laterality: N/A;    Allergies: No Known Allergies  Family History: No pertinent family history  Social History:  reports that he has never smoked. He has never used smokeless tobacco. He reports that he does not drink alcohol. No history on file for drug.  ROS: Please see  flowsheet from today's date for complete review of systems.  Physical Exam: BP (!) 142/67   Pulse (!) 101   Ht 5\' 9"  (1.753 m)   Wt (!) 350 lb (158.8 kg)   BMI 51.69 kg/m    Constitutional:  Alert and oriented, No acute distress. Cardiovascular: No clubbing, cyanosis, or edema. Respiratory: Normal respiratory effort, no increased work of breathing. GI: Abdomen is soft, nontender, nondistended, no abdominal masses GU: Left CVA tenderness Lymph: No cervical or inguinal lymphadenopathy. Skin: No rashes, bruises or suspicious lesions. Neurologic: Grossly intact, no focal deficits, moving all 4 extremities. Psychiatric: Normal mood and affect.  Laboratory Data: Reviewed  Urinalysis today 0-5 WBCs, 0-2 RBCs, no bacteria, no yeast, nitrite negative  Pertinent Imaging: I personally reviewed the CT scan.  Left 1.3 cm UPJ stone, 750HU, 23 cm skin to stone distance.  Assessment & Plan:   In summary, the patient is a 68 year old male with 1 week of left-sided flank pain secondary to a 1.3 cm left UPJ calculus.  He has no clinical or laboratory signs of infection.  We discussed various treatment options for urolithiasis including observation with or without medical expulsive therapy, shockwave lithotripsy (SWL), ureteroscopy and laser lithotripsy with stent placement, and percutaneous nephrolithotomy.  We discussed that management is based on stone size, location, density, patient co-morbidities, and patient preference.   Stones <74mm in size have a >80% spontaneous passage rate. Data surrounding the use of tamsulosin for  medical expulsive therapy is controversial, but meta analyses suggests it is most efficacious for distal stones between 5-61mm in size. Possible side effects include dizziness/lightheadedness, and retrograde ejaculation.  SWL has a lower stone free rate in a single procedure, but also a lower complication rate compared to ureteroscopy and avoids a stent and associated stent  related symptoms. Possible complications include renal hematoma, steinstrasse, and need for additional treatment.  Ureteroscopy with laser lithotripsy and stent placement has a higher stone free rate than SWL in a single procedure, however increased complication rate including possible infection, ureteral injury, bleeding, and stent related morbidity. Common stent related symptoms include dysuria, urgency/frequency, and flank pain.  He is not a good candidate for shockwave lithotripsy secondary to his very large skin to stone distance.  We discussed he would likely have a poor outcome with shockwave lithotripsy, and I strongly recommended ureteroscopy with laser lithotripsy and stent placement.  Schedule left ureteroscopy, laser lithotripsy, stent placement Refill of Norco called in Send urine for culture today  Billey Co, MD  [facility] [address] [place], [place] [address] [phone]

## 2019-01-19 NOTE — Patient Instructions (Signed)
Laser Therapy for Kidney Stones Laser therapy for kidney stones is a procedure to break up small, hard mineral deposits that form in the kidney (kidney stones). The procedure is done using a device that produces a focused beam of light (laser). The laser breaks up kidney stones into pieces that are small enough to be passed out of the body through urination or removed from the body during the procedure. You may need laser therapy if you have kidney stones that are painful or block your urinary tract. This procedure is done by inserting a tube (ureteroscope) into your kidney through the urethral opening. The urethra is the part of the body that drains urine from the bladder. In women, the urethra opens above the vaginal opening. In men, the urethra opens at the tip of the penis. The ureteroscope is inserted through the urethra, and surgical instruments are moved through the bladder and the muscular tube that connects the kidney to the bladder (ureter) until they reach the kidney. Tell a health care provider about:  Any allergies you have.  All medicines you are taking, including vitamins, herbs, eye drops, creams, and over-the-counter medicines.  Any problems you or family members have had with anesthetic medicines.  Any blood disorders you have.  Any surgeries you have had.  Any medical conditions you have.  Whether you are pregnant or may be pregnant. What are the risks? Generally, this is a safe procedure. However, problems may occur, including:  Infection.  Bleeding.  Allergic reactions to medicines.  Damage to the urethra, bladder, or ureter.  Urinary tract infection (UTI).  Narrowing of the urethra (urethral stricture).  Difficulty passing urine.  Blockage of the kidney caused by a fragment of kidney stone. What happens before the procedure? Medicines  Ask your health care provider about: ? Changing or stopping your regular medicines. This is especially important if you  are taking diabetes medicines or blood thinners. ? Taking medicines such as aspirin and ibuprofen. These medicines can thin your blood. Do not take these medicines unless your health care provider tells you to take them. ? Taking over-the-counter medicines, vitamins, herbs, and supplements. Eating and drinking Follow instructions from your health care provider about eating and drinking, which may include:  8 hours before the procedure - stop eating heavy meals or foods, such as meat, fried foods, or fatty foods.  6 hours before the procedure - stop eating light meals or foods, such as toast or cereal.  6 hours before the procedure - stop drinking milk or drinks that contain milk.  2 hours before the procedure - stop drinking clear liquids. Staying hydrated Follow instructions from your health care provider about hydration, which may include:  Up to 2 hours before the procedure - you may continue to drink clear liquids, such as water, clear fruit juice, black coffee, and plain tea.  General instructions  You may have a physical exam before the procedure. You may also have tests, such as imaging tests and blood or urine tests.  If your ureter is too narrow, your health care provider may place a soft, flexible tube (stent) inside of it. The stent may be placed days or weeks before your laser therapy procedure.  Plan to have someone take you home from the hospital or clinic.  If you will be going home right after the procedure, plan to have someone stay with you for 24 hours.  Do not use any products that contain nicotine or tobacco for at least 4  weeks before the procedure. These products include cigarettes, e-cigarettes, and chewing tobacco. If you need help quitting, ask your health care provider.  Ask your health care provider: ? How your surgical site will be marked or identified. ? What steps will be taken to help prevent infection. These may include:  Removing hair at the surgery  site.  Washing skin with a germ-killing soap.  Taking antibiotic medicine. What happens during the procedure?   An IV will be inserted into one of your veins.  You will be given one or more of the following: ? A medicine to help you relax (sedative). ? A medicine to numb the area (local anesthetic). ? A medicine to make you fall asleep (general anesthetic).  A ureteroscope will be inserted into your urethra. The ureteroscope will send images to a video screen in the operating room to guide your surgeon to the area of your kidney that will be treated.  A small, flexible tube will be threaded through the ureteroscope and into your bladder and ureter, up to your kidney.  The laser device will be inserted into your kidney through the tube. Your surgeon will pulse the laser on and off to break up kidney stones.  A surgical instrument that has a tiny wire basket may be inserted through the tube into your kidney to remove the pieces of broken kidney stone. The procedure may vary among health care providers and hospitals. What happens after the procedure?  Your blood pressure, heart rate, breathing rate, and blood oxygen level will be monitored until you leave the hospital or clinic.  You will be given pain medicine as needed.  You may continue to receive antibiotics.  You may have a stent temporarily placed in your ureter.  Do not drive for 24 hours if you were given a sedative during your procedure.  You may be given a strainer to collect any stone fragments that you pass in your urine. Your health care provider may have these tested. Summary  Laser therapy for kidney stones is a procedure to break up kidney stones into pieces that are small enough to be passed out of the body through urination or removed during the procedure.  Follow instructions from your health care provider about eating and drinking before the procedure.  During the procedure, the ureteroscope will send images  to a video screen to guide your surgeon to the area of your kidney that will be treated.  Do not drive for 24 hours if you were given a sedative during your procedure. This information is not intended to replace advice given to you by your health care provider. Make sure you discuss any questions you have with your health care provider. Document Released: 06/22/2015 Document Revised: 02/04/2018 Document Reviewed: 02/04/2018 Elsevier Patient Education  [employer].   Ureteral Stent Implantation  Ureteral stent implantation is a procedure to insert (implant) a flexible, soft, plastic tube (stent) into a ureter. Ureters are the tube-like parts of the body that drain urine from the kidneys. The stent supports the ureter while it heals and helps to drain urine. You may have a ureteral stent implanted after having a procedure to remove a blockage from the ureter (ureterolysis or pyeloplasty). You may also have a stent implanted to open the flow of urine when you have a blockage caused by a kidney stone, tumor, blood clot, or infection. You have two ureters, one on each side of the body. The ureters connect the kidneys to the organ  that holds urine until it passes out of the body (bladder). The stent is placed so that one end is in the kidney, and one end is in the bladder. The stent is usually taken out after your ureter has healed. Depending on your condition, you may have a stent for just a few weeks, or you may have a long-term stent that will need to be replaced every few months. Tell a health care provider about:  Any allergies you have.  All medicines you are taking, including vitamins, herbs, eye drops, creams, and over-the-counter medicines.  Any problems you or family members have had with anesthetic medicines.  Any blood disorders you have.  Any surgeries you have had.  Any medical conditions you have.  Whether you are pregnant or may be pregnant. What are the risks? Generally,  this is a safe procedure. However, problems may occur, including:  Infection.  Bleeding.  Allergic reactions to medicines.  Damage to other structures or organs. Tearing (perforation) of the ureter is possible.  Movement of the stent away from where it is placed during surgery (migration). What happens before the procedure? Medicines Ask your health care provider about:  Changing or stopping your regular medicines. This is especially important if you are taking diabetes medicines or blood thinners.  Taking medicines such as aspirin and ibuprofen. These medicines can thin your blood. Do not take these medicines unless your health care provider tells you to take them.  Taking over-the-counter medicines, vitamins, herbs, and supplements. Eating and drinking Follow instructions from your health care provider about eating and drinking, which may include:  8 hours before the procedure - stop eating heavy meals or foods, such as meat, fried foods, or fatty foods.  6 hours before the procedure - stop eating light meals or foods, such as toast or cereal.  6 hours before the procedure - stop drinking milk or drinks that contain milk.  2 hours before the procedure - stop drinking clear liquids. Staying hydrated Follow instructions from your health care provider about hydration, which may include:  Up to 2 hours before the procedure - you may continue to drink clear liquids, such as water, clear fruit juice, black coffee, and plain tea. General instructions  Do not drink alcohol.  Do not use any products that contain nicotine or tobacco for at least 4 weeks before the procedure. These products include cigarettes, e-cigarettes, and chewing tobacco. If you need help quitting, ask your health care provider.  You may have an exam or testing, such as imaging or blood tests.  Ask your health care provider what steps will be taken to help prevent infection. These may include: ? Removing hair  at the surgery site. ? Washing skin with a germ-killing soap. ? Taking antibiotic medicine.  Plan to have someone take you home from the hospital or clinic.  If you will be going home right after the procedure, plan to have someone with you for 24 hours. What happens during the procedure?  An IV will be inserted into one of your veins.  You may be given a medicine to help you relax (sedative).  You may be given a medicine to make you fall asleep (general anesthetic).  A thin, tube-shaped instrument with a light and tiny camera at the end (cystoscope) will be inserted into your urethra. The urethra is the tube that drains urine from the bladder out of the body. In men, the urethra opens at the end of the penis. In women,  the urethra opens in front of the vaginal opening.  The cystoscope will be passed into your bladder.  A thin wire (guide wire) will be passed through your bladder and into your ureter. This is used to guide the stent into your ureter.  The stent will be inserted into your ureter.  The guide wire and the cystoscope will be removed.  A flexible tube (catheter) may be inserted through your urethra so that one end is in your bladder. This helps to drain urine from your bladder. The procedure may vary among hospitals and health care providers. What happens after the procedure?  Your blood pressure, heart rate, breathing rate, and blood oxygen level will be monitored until you leave the hospital or clinic.  You may continue to receive medicine and fluids through an IV.  You may have some soreness or pain in your abdomen and urethra. Medicines will be available to help you.  You will be encouraged to get up and walk around as soon as you can.  You may have a catheter draining your urine.  You will have some blood in your urine.  Do not drive for 24 hours if you were given a sedative during your procedure. Summary  Ureteral stent implantation is a procedure to  insert a flexible, soft, plastic tube (stent) into a ureter.  You may have a stent implanted to support the ureter while it heals after a procedure or to open the flow of urine if there is a blockage.  Follow instructions from your health care provider about taking medicines and about eating and drinking before the procedure.  Depending on your condition, you may have a stent for just a few weeks, or you may have a long-term stent that will need to be replaced every few months. This information is not intended to replace advice given to you by your health care provider. Make sure you discuss any questions you have with your health care provider. Document Released: 05/23/2000 Document Revised: 03/02/2018 Document Reviewed: 03/03/2018 Elsevier Patient Education  2020 [employer].

## 2019-01-20 ENCOUNTER — Other Ambulatory Visit: Payer: Self-pay

## 2019-01-20 ENCOUNTER — Encounter
Admission: RE | Admit: 2019-01-20 | Discharge: 2019-01-20 | Disposition: A | Discharge status: Home / Self Care | Payer: Medicare HMO | Source: Ambulatory Visit | Attending: Urology | Admitting: Urology

## 2019-01-20 DIAGNOSIS — G4730 Sleep apnea, unspecified: Secondary | ICD-10-CM | POA: Diagnosis not present

## 2019-01-20 DIAGNOSIS — Z01812 Encounter for preprocedural laboratory examination: Secondary | ICD-10-CM | POA: Insufficient documentation

## 2019-01-20 DIAGNOSIS — N132 Hydronephrosis with renal and ureteral calculous obstruction: Secondary | ICD-10-CM | POA: Diagnosis not present

## 2019-01-20 DIAGNOSIS — Z20828 Contact with and (suspected) exposure to other viral communicable diseases: Secondary | ICD-10-CM | POA: Diagnosis not present

## 2019-01-20 DIAGNOSIS — M1990 Unspecified osteoarthritis, unspecified site: Secondary | ICD-10-CM | POA: Diagnosis not present

## 2019-01-20 DIAGNOSIS — Z6841 Body Mass Index (BMI) 40.0 and over, adult: Secondary | ICD-10-CM | POA: Diagnosis not present

## 2019-01-20 DIAGNOSIS — I10 Essential (primary) hypertension: Secondary | ICD-10-CM | POA: Diagnosis not present

## 2019-01-20 DIAGNOSIS — Z87442 Personal history of urinary calculi: Secondary | ICD-10-CM | POA: Diagnosis present

## 2019-01-20 HISTORY — DX: Personal history of urinary calculi: Z87.442

## 2019-01-20 HISTORY — DX: Essential (primary) hypertension: I10

## 2019-01-20 LAB — MICROSCOPIC EXAMINATION: Bacteria, UA: NONE SEEN

## 2019-01-20 LAB — URINALYSIS, COMPLETE
Bilirubin, UA: NEGATIVE
Glucose, UA: NEGATIVE
Ketones, UA: NEGATIVE
Leukocytes,UA: NEGATIVE
Nitrite, UA: NEGATIVE
Protein,UA: NEGATIVE
Specific Gravity, UA: 1.025 (ref 1.005–1.030)
Urobilinogen, Ur: 0.2 mg/dL (ref 0.2–1.0)
pH, UA: 5.5 (ref 5.0–7.5)

## 2019-01-20 LAB — SARS CORONAVIRUS 2 (TAT 6-24 HRS): SARS Coronavirus 2: NEGATIVE

## 2019-01-20 NOTE — Patient Instructions (Addendum)
Your procedure is scheduled on: 01/24/2019 Mon Report to Same Day Surgery 2nd floor medical mall [facility] Entrance-take elevator on left to 2nd floor.  Check in with surgery information desk.) To find out your arrival time please call [phone] between 1PM - 3PM on 01/21/2019 Fri  Remember: Instructions that are not followed completely may result in serious medical risk, up to and including death, or upon the discretion of your surgeon and anesthesiologist your surgery may need to be rescheduled.    _x___ 1. Do not eat food after midnight the night before your procedure. You may drink clear liquids up to 2 hours before you are scheduled to arrive at the hospital for your procedure.  Do not drink clear liquids within 2 hours of your scheduled arrival to the hospital.  Clear liquids include  --Water or Apple juice without pulp  --Clear carbohydrate beverage such as ClearFast or Gatorade  --Black Coffee or Clear Tea (No milk, no creamers, do not add anything to                  the coffee or Tea Type 1 and type 2 diabetics should only drink water.   ____Ensure clear carbohydrate drink on the way to the hospital for bariatric patients  ____Ensure clear carbohydrate drink 3 hours before surgery.   No gum chewing or hard candies.     __x__ 2. No Alcohol for 24 hours before or after surgery.   __x__3. No Smoking or e-cigarettes for 24 prior to surgery.  Do not use any chewable tobacco products for at least 6 hour prior to surgery   ____  4. Bring all medications with you on the day of surgery if instructed.    __x__ 5. Notify your doctor if there is any change in your medical condition     (cold, fever, infections).    x___6. On the morning of surgery brush your teeth with toothpaste and water.  You may rinse your mouth with mouth wash if you wish.  Do not swallow any toothpaste or mouthwash.   Do not wear jewelry, make-up, hairpins, clips or nail polish.  Do not wear lotions,  powders, or perfumes. You may wear deodorant.  Do not shave 48 hours prior to surgery. Men may shave face and neck.  Do not bring valuables to the hospital.    [facility] is not responsible for any belongings or valuables.               Contacts, dentures or bridgework may not be worn into surgery.  Leave your suitcase in the car. After surgery it may be brought to your room.  For patients admitted to the hospital, discharge time is determined by your                       treatment team.  _  Patients discharged the day of surgery will not be allowed to drive home.  You will need someone to drive you home and stay with you the night of your procedure.    Please read over the following fact sheets that you were given:   [facility] Preparing for Surgery and or MRSA Information   _x___ Take anti-hypertensive listed below, cardiac, seizure, asthma,     anti-reflux and psychiatric medicines. These include:  1. atorvastatin (LIPITOR) 40 MG tablet  2.  3.  4.  5.  6.  ____Fleets enema or Magnesium Citrate as directed.   ____  Use CHG Soap or sage wipes as directed on instruction sheet   ____ Use inhalers on the day of surgery and bring to hospital day of surgery  ____ Stop Metformin and Janumet 2 days prior to surgery.    ____ Take 1/2 of usual insulin dose the night before surgery and none on the morning     surgery.   _x___ Follow recommendations from Cardiologist, Pulmonologist or PCP regarding          stopping Aspirin, Coumadin, Plavix ,Eliquis, Effient, or Pradaxa, and Pletal.  X____Stop Anti-inflammatories such as Advil, Aleve, Ibuprofen, Motrin, Naproxen, Naprosyn, Goodies powders or aspirin products. OK to take Tylenol and                          Celebrex.   _x___ Stop supplements until after surgery.  But may continue Vitamin D, Vitamin B,       and multivitamin. Stop flax seed oil today.   _x___ Bring C-Pap to the hospital.

## 2019-01-20 NOTE — Pre-Procedure Instructions (Signed)
Abnormal EKG, secure chat with Dr. Andree Elk.

## 2019-01-20 NOTE — Pre-Procedure Instructions (Signed)
Spoke with Dr Andree Elk regarding EKG, medical clearance requested.

## 2019-01-22 LAB — CULTURE, URINE COMPREHENSIVE

## 2019-01-23 MED ORDER — CEFAZOLIN SODIUM-DEXTROSE 2-4 GM/100ML-% IV SOLN
2.0000 g | INTRAVENOUS | Status: AC
  Administered 2019-01-24: 3 g via INTRAVENOUS

## 2019-01-24 ENCOUNTER — Ambulatory Visit: Payer: Medicare HMO

## 2019-01-24 ENCOUNTER — Encounter
Admission: RE | Disposition: A | Discharge status: Home / Self Care | Payer: Self-pay | Source: Ambulatory Visit | Attending: Urology

## 2019-01-24 ENCOUNTER — Other Ambulatory Visit: Payer: Self-pay

## 2019-01-24 ENCOUNTER — Ambulatory Visit
Admission: RE | Admit: 2019-01-24 | Discharge: 2019-01-24 | Disposition: A | Discharge status: Home / Self Care | Payer: Medicare HMO | Source: Ambulatory Visit | Attending: Urology | Admitting: Urology

## 2019-01-24 ENCOUNTER — Ambulatory Visit: Payer: Medicare HMO | Admitting: Anesthesiology

## 2019-01-24 ENCOUNTER — Encounter: Payer: Self-pay | Admitting: *Deleted

## 2019-01-24 DIAGNOSIS — G4730 Sleep apnea, unspecified: Secondary | ICD-10-CM | POA: Insufficient documentation

## 2019-01-24 DIAGNOSIS — Z6841 Body Mass Index (BMI) 40.0 and over, adult: Secondary | ICD-10-CM | POA: Insufficient documentation

## 2019-01-24 DIAGNOSIS — I10 Essential (primary) hypertension: Secondary | ICD-10-CM | POA: Insufficient documentation

## 2019-01-24 DIAGNOSIS — N132 Hydronephrosis with renal and ureteral calculous obstruction: Secondary | ICD-10-CM | POA: Diagnosis not present

## 2019-01-24 DIAGNOSIS — Z87442 Personal history of urinary calculi: Secondary | ICD-10-CM | POA: Insufficient documentation

## 2019-01-24 DIAGNOSIS — N201 Calculus of ureter: Secondary | ICD-10-CM

## 2019-01-24 DIAGNOSIS — N200 Calculus of kidney: Secondary | ICD-10-CM

## 2019-01-24 DIAGNOSIS — Z20828 Contact with and (suspected) exposure to other viral communicable diseases: Secondary | ICD-10-CM | POA: Insufficient documentation

## 2019-01-24 DIAGNOSIS — M1990 Unspecified osteoarthritis, unspecified site: Secondary | ICD-10-CM | POA: Insufficient documentation

## 2019-01-24 HISTORY — PX: CYSTOSCOPY WITH STENT PLACEMENT: SHX5790

## 2019-01-24 HISTORY — PX: CYSTOSCOPY W/ RETROGRADES: SHX1426

## 2019-01-24 SURGERY — CYSTOSCOPY, WITH RETROGRADE PYELOGRAM
Anesthesia: General | Site: Ureter | Laterality: Left

## 2019-01-24 MED ORDER — MIDAZOLAM HCL 2 MG/2ML IJ SOLN
INTRAMUSCULAR | Status: AC
  Filled 2019-01-24: qty 2

## 2019-01-24 MED ORDER — CEFAZOLIN SODIUM-DEXTROSE 2-4 GM/100ML-% IV SOLN
INTRAVENOUS | Status: AC
  Filled 2019-01-24: qty 100

## 2019-01-24 MED ORDER — DEXAMETHASONE SODIUM PHOSPHATE 10 MG/ML IJ SOLN
INTRAMUSCULAR | Status: DC | PRN
  Administered 2019-01-24: 10 mg via INTRAVENOUS

## 2019-01-24 MED ORDER — IOHEXOL 180 MG/ML  SOLN
INTRAMUSCULAR | Status: DC | PRN
  Administered 2019-01-24: 20 mL

## 2019-01-24 MED ORDER — MIDAZOLAM HCL 2 MG/2ML IJ SOLN
INTRAMUSCULAR | Status: DC | PRN
  Administered 2019-01-24: 2 mg via INTRAVENOUS

## 2019-01-24 MED ORDER — LIDOCAINE 2% (20 MG/ML) 5 ML SYRINGE
INTRAMUSCULAR | Status: DC | PRN
  Administered 2019-01-24: 100 mg via INTRAVENOUS

## 2019-01-24 MED ORDER — LIDOCAINE HCL (PF) 2 % IJ SOLN
INTRAMUSCULAR | Status: AC
  Filled 2019-01-24: qty 10

## 2019-01-24 MED ORDER — ROCURONIUM BROMIDE 100 MG/10ML IV SOLN
INTRAVENOUS | Status: DC | PRN
  Administered 2019-01-24: 5 mg via INTRAVENOUS
  Administered 2019-01-24: 35 mg via INTRAVENOUS

## 2019-01-24 MED ORDER — SUGAMMADEX SODIUM 200 MG/2ML IV SOLN
INTRAVENOUS | Status: DC | PRN
  Administered 2019-01-24: 400 mg via INTRAVENOUS

## 2019-01-24 MED ORDER — ONDANSETRON HCL 4 MG/2ML IJ SOLN
INTRAMUSCULAR | Status: DC | PRN
  Administered 2019-01-24: 4 mg via INTRAVENOUS

## 2019-01-24 MED ORDER — PHENYLEPHRINE HCL (PRESSORS) 10 MG/ML IV SOLN
INTRAVENOUS | Status: DC | PRN
  Administered 2019-01-24 (×2): 100 ug via INTRAVENOUS
  Administered 2019-01-24: 200 ug via INTRAVENOUS

## 2019-01-24 MED ORDER — PROPOFOL 10 MG/ML IV BOLUS
INTRAVENOUS | Status: DC | PRN
  Administered 2019-01-24: 50 mg via INTRAVENOUS
  Administered 2019-01-24: 150 mg via INTRAVENOUS

## 2019-01-24 MED ORDER — FAMOTIDINE 20 MG PO TABS
ORAL_TABLET | ORAL | Status: AC
  Administered 2019-01-24: 20 mg via ORAL
  Filled 2019-01-24: qty 1

## 2019-01-24 MED ORDER — DEXAMETHASONE SODIUM PHOSPHATE 10 MG/ML IJ SOLN
INTRAMUSCULAR | Status: AC
  Filled 2019-01-24: qty 1

## 2019-01-24 MED ORDER — SUCCINYLCHOLINE CHLORIDE 20 MG/ML IJ SOLN
INTRAMUSCULAR | Status: DC | PRN
  Administered 2019-01-24: 140 mg via INTRAVENOUS

## 2019-01-24 MED ORDER — ONDANSETRON HCL 4 MG/2ML IJ SOLN
INTRAMUSCULAR | Status: AC
  Filled 2019-01-24: qty 2

## 2019-01-24 MED ORDER — PROPOFOL 10 MG/ML IV BOLUS
INTRAVENOUS | Status: AC
  Filled 2019-01-24: qty 20

## 2019-01-24 MED ORDER — LACTATED RINGERS IV SOLN
INTRAVENOUS | Status: DC | PRN
  Administered 2019-01-24: 13:00:00 via INTRAVENOUS

## 2019-01-24 MED ORDER — ROCURONIUM BROMIDE 50 MG/5ML IV SOLN
INTRAVENOUS | Status: AC
  Filled 2019-01-24: qty 1

## 2019-01-24 MED ORDER — FENTANYL CITRATE (PF) 100 MCG/2ML IJ SOLN
INTRAMUSCULAR | Status: DC | PRN
  Administered 2019-01-24: 100 ug via INTRAVENOUS

## 2019-01-24 MED ORDER — OXYBUTYNIN CHLORIDE 5 MG PO TABS: 5.0000 mg | ORAL_TABLET | Freq: Three times a day (TID) | ORAL | 0 refills | Status: DC | PRN

## 2019-01-24 MED ORDER — FAMOTIDINE 20 MG PO TABS
20.0000 mg | ORAL_TABLET | Freq: Once | ORAL | Status: AC
  Administered 2019-01-24: 11:00:00 20 mg via ORAL

## 2019-01-24 MED ORDER — ONDANSETRON HCL 4 MG/2ML IJ SOLN: 4.0000 mg | Freq: Once | INTRAMUSCULAR | Status: DC | PRN

## 2019-01-24 MED ORDER — FENTANYL CITRATE (PF) 100 MCG/2ML IJ SOLN: 25.0000 ug | INTRAMUSCULAR | Status: DC | PRN

## 2019-01-24 MED ORDER — SUGAMMADEX SODIUM 200 MG/2ML IV SOLN
INTRAVENOUS | Status: AC
  Filled 2019-01-24: qty 2

## 2019-01-24 MED ORDER — SUCCINYLCHOLINE CHLORIDE 20 MG/ML IJ SOLN
INTRAMUSCULAR | Status: AC
  Filled 2019-01-24: qty 1

## 2019-01-24 MED ORDER — FENTANYL CITRATE (PF) 250 MCG/5ML IJ SOLN
INTRAMUSCULAR | Status: AC
  Filled 2019-01-24: qty 5

## 2019-01-24 MED ORDER — LACTATED RINGERS IV SOLN
INTRAVENOUS | Status: DC
  Administered 2019-01-24: 12:00:00 via INTRAVENOUS

## 2019-01-24 SURGICAL SUPPLY — 28 items
BAG DRAIN CYSTO-URO LG1000N (MISCELLANEOUS) ×4 IMPLANT
BASKET ZERO TIP 1.9FR (BASKET) IMPLANT
BRUSH SCRUB EZ 1% IODOPHOR (MISCELLANEOUS) ×4 IMPLANT
CATH URETL 5X70 OPEN END (CATHETERS) ×4 IMPLANT
CNTNR SPEC 2.5X3XGRAD LEK (MISCELLANEOUS)
CONT SPEC 4OZ STER OR WHT (MISCELLANEOUS)
CONTAINER SPEC 2.5X3XGRAD LEK (MISCELLANEOUS) IMPLANT
DRAPE UTILITY 15X26 TOWEL STRL (DRAPES) ×4 IMPLANT
FIBER LASER TRAC TIP (UROLOGICAL SUPPLIES) IMPLANT
GLIDEWIRE STIFF .35X180X3 HYDR (WIRE) ×2 IMPLANT
GLOVE BIO SURGEON STRL SZ 6.5 (GLOVE) ×3 IMPLANT
GLOVE BIO SURGEONS STRL SZ 6.5 (GLOVE) ×1
GOWN STRL REUS W/ TWL LRG LVL3 (GOWN DISPOSABLE) ×4 IMPLANT
GOWN STRL REUS W/TWL LRG LVL3 (GOWN DISPOSABLE) ×4
GUIDEWIRE GREEN .038 145CM (MISCELLANEOUS) IMPLANT
GUIDEWIRE STR DUAL SENSOR (WIRE) ×4 IMPLANT
INFUSOR MANOMETER BAG 3000ML (MISCELLANEOUS) ×4 IMPLANT
INTRODUCER DILATOR DOUBLE (INTRODUCER) ×2 IMPLANT
KIT TURNOVER CYSTO (KITS) ×4 IMPLANT
PACK CYSTO AR (MISCELLANEOUS) ×4 IMPLANT
SET CYSTO W/LG BORE CLAMP LF (SET/KITS/TRAYS/PACK) ×4 IMPLANT
SHEATH URETERAL 12FRX35CM (MISCELLANEOUS) ×2 IMPLANT
SOL .9 NS 3000ML IRR  AL (IV SOLUTION) ×2
SOL .9 NS 3000ML IRR UROMATIC (IV SOLUTION) ×2 IMPLANT
STENT URET 6FRX24 CONTOUR (STENTS) IMPLANT
STENT URET 6FRX26 CONTOUR (STENTS) ×2 IMPLANT
SURGILUBE 2OZ TUBE FLIPTOP (MISCELLANEOUS) ×4 IMPLANT
WATER STERILE IRR 1000ML POUR (IV SOLUTION) ×4 IMPLANT

## 2019-01-24 NOTE — Discharge Instructions (Signed)
AMBULATORY SURGERY  DISCHARGE INSTRUCTIONS   1) The drugs that you were given will stay in your system until tomorrow so for the next 24 hours you should not:  A) Drive an automobile B) Make any legal decisions C) Drink any alcoholic beverage   2) You may resume regular meals tomorrow.  Today it is better to start with liquids and gradually work up to solid foods.  You may eat anything you prefer, but it is better to start with liquids, then soup and crackers, and gradually work up to solid foods.   3) Please notify your doctor immediately if you have any unusual bleeding, trouble breathing, redness and pain at the surgery site, drainage, fever, or pain not relieved by medication. 4)   5) Your post-operative visit with Dr.                                     is: Date:                        Time:    Please call to schedule your post-operative visit.  6) Additional Instructions:     You have a ureteral stent in place.  This is a tube that extends from your kidney to your bladder.  This may cause urinary bleeding, burning with urination, and urinary frequency.  Please call our office or present to the ED if you develop fevers >101 or pain which is not able to be controlled with oral pain medications.  You may be given either Flomax and/ or ditropan to help with bladder spasms and stent pain in addition to pain medications.    We were unable to reach her stone.  Will be return in about 2 weeks to the operating room for second attempt.  [place] [address], [place], [place] [address] [phone]

## 2019-01-24 NOTE — Anesthesia Preprocedure Evaluation (Addendum)
Anesthesia Evaluation  Patient identified by MRN, date of birth, ID band Patient awake    Reviewed: Allergy & Precautions, H&P , NPO status , Patient's Chart, lab work & pertinent test results, reviewed documented beta blocker date and time   History of Anesthesia Complications (+) Family history of anesthesia reaction  Airway Mallampati: III  TM Distance: >3 FB     Dental  (+) Chipped   Pulmonary sleep apnea ,           Cardiovascular hypertension, Pt. on medications      Neuro/Psych negative neurological ROS  negative psych ROS   GI/Hepatic negative GI ROS, Neg liver ROS,   Endo/Other  Morbid obesity (super morbid obesity BMI 52)  Renal/GU   negative genitourinary   Musculoskeletal  (+) Arthritis ,   Abdominal   Peds  Hematology negative hematology ROS (+)   Anesthesia Other Findings Past Medical History: No date: Arthritis     Comment:  KNEE JOINT No date: Family history of adverse reaction to anesthesia     Comment:  MOTHER LOST TASTE AFTER SURGERY No date: History of kidney stones No date: Hypertension No date: Seasonal allergies No date: Sleep apnea     Comment:  CPAP  Past Surgical History: 2005: COLONOSCOPY 02/27/2015: COLONOSCOPY WITH PROPOFOL; N/A     Comment:  Procedure: COLONOSCOPY WITH PROPOFOL;  Surgeon: Lucilla Lame, MD;  Location: [facility] END[facility]COPY;  Service: Endoscopy;              Laterality: N/A;     Reproductive/Obstetrics negative OB ROS                            Anesthesia Physical Anesthesia Plan  ASA: III  Anesthesia Plan: General ETT   Post-op Pain Management:    Induction: Intravenous  PONV Risk Score and Plan: Ondansetron, Dexamethasone, Midazolam and Treatment may vary due to age or medical condition  Airway Management Planned: Oral ETT  Additional Equipment:   Intra-op Plan:   Post-operative Plan:   Informed Consent: I  have reviewed the patients History and Physical, chart, labs and discussed the procedure including the risks, benefits and alternatives for the proposed anesthesia with the patient or authorized representative who has indicated his/her understanding and acceptance.     Dental Advisory Given  Plan Discussed with: Anesthesiologist and CRNA  Anesthesia Plan Comments:        Anesthesia Quick Evaluation

## 2019-01-24 NOTE — Transfer of Care (Signed)
Immediate Anesthesia Transfer of Care Note  Patient: Carlos Carroll  Procedure(s) Performed: CYSTOSCOPY WITH RETROGRADE PYELOGRAM (Left Ureter) CYSTOSCOPY WITH STENT PLACEMENT (Left Ureter)  Patient Location: PACU  Anesthesia Type:General  Level of Consciousness: awake, alert  and oriented  Airway & Oxygen Therapy: Patient Spontanous Breathing and Patient connected to face mask oxygen  Post-op Assessment: Report given to RN and Post -op Vital signs reviewed and stable  Post vital signs: Reviewed and stable  Last Vitals:  Vitals Value Taken Time  BP 116/59 01/24/19 1427  Temp 37.2 C 01/24/19 1427  Pulse 87 01/24/19 1427  Resp 16 01/24/19 1427  SpO2 94 % 01/24/19 1427  Vitals shown include unvalidated device data.  Last Pain:  Vitals:   01/24/19 1044  TempSrc: Temporal  PainSc: 0-No pain         Complications: No apparent anesthesia complications

## 2019-01-24 NOTE — Anesthesia Procedure Notes (Signed)
Procedure Name: Intubation Date/Time: 01/24/2019 1:49 PM Performed by: Marsh Dolly, CRNA Pre-anesthesia Checklist: Patient identified, Patient being monitored, Timeout performed, Emergency Drugs available and Suction available Patient Re-evaluated:Patient Re-evaluated prior to induction Oxygen Delivery Method: Circle system utilized Preoxygenation: Pre-oxygenation with 100% oxygen Induction Type: IV induction Ventilation: Mask ventilation without difficulty Laryngoscope Size: McGraph and 4 Grade View: Grade I Tube type: Oral Tube size: 7.5 mm Number of attempts: 2 Airway Equipment and Method: Stylet Placement Confirmation: ETT inserted through vocal cords under direct vision,  positive ETCO2 and breath sounds checked- equal and bilateral Secured at: 21 cm Tube secured with: Tape Dental Injury: Teeth and Oropharynx as per pre-operative assessment

## 2019-01-24 NOTE — Anesthesia Post-op Follow-up Note (Signed)
Anesthesia QCDR form completed.        

## 2019-01-24 NOTE — Anesthesia Postprocedure Evaluation (Signed)
Anesthesia Post Note  Patient: Carlos Carroll  Procedure(s) Performed: CYSTOSCOPY WITH RETROGRADE PYELOGRAM (Left Ureter) CYSTOSCOPY WITH STENT PLACEMENT (Left Ureter)  Patient location during evaluation: PACU Anesthesia Type: General Level of consciousness: awake and alert Pain management: pain level controlled Vital Signs Assessment: post-procedure vital signs reviewed and stable Respiratory status: spontaneous breathing, nonlabored ventilation, respiratory function stable and patient connected to nasal cannula oxygen Cardiovascular status: blood pressure returned to baseline and stable Postop Assessment: no apparent nausea or vomiting Anesthetic complications: no     Last Vitals:  Vitals:   01/24/19 1447 01/24/19 1457  BP:  118/60  Pulse: 75 85  Resp: 11 10  Temp:    SpO2: 96% 94%    Last Pain:  Vitals:   01/24/19 1442  TempSrc:   PainSc: 0-No pain                 Billal Rollo S

## 2019-01-24 NOTE — Interval H&P Note (Signed)
History and Physical Interval Note:  01/24/2019 12:49 PM  Carlos Carroll  has presented today for surgery, with the diagnosis of left 1cm proximal ureteral stone.  The various methods of treatment have been discussed with the patient and family. After consideration of risks, benefits and other options for treatment, the patient has consented to  Procedure(s): CYSTOSCOPY/URETEROSCOPY/HOLMIUM LASER/STENT PLACEMENT (Left) as a surgical intervention.  The patient's history has been reviewed, patient examined, no change in status, stable for surgery.  I have reviewed the patient's chart and labs.  Questions were answered to the patient's satisfaction.    RRR CTAB   Hollice Espy

## 2019-01-24 NOTE — Op Note (Signed)
Date of procedure: 01/24/19  Preoperative diagnosis:  1. Left proximal ureteral calculus  Postoperative diagnosis:  1. Same as above  Procedure: 1. Cystoscopy 2. Left ureteroscopy 3. Left retrograde pyelogram 4. Left ureteral stent placement  Surgeon: Hollice Espy, MD  Anesthesia: General  Complications: None  Intraoperative findings: Unable to advance scope to the level of the stone, some difficulty getting wire around the stent but ultimately successful.  Stent placed with plan for staged procedure.  EBL: Minimal  Specimens: None  Drains: 6 x 26 French double-J ureteral stent on the left  Indication: Carlos Carroll is a 68 y.o. patient with 13 mm left proximal ureteral calculus.  After reviewing the management options for treatment, he elected to proceed with the above surgical procedure(s). We have discussed the potential benefits and risks of the procedure, side effects of the proposed treatment, the likelihood of the patient achieving the goals of the procedure, and any potential problems that might occur during the procedure or recuperation. Informed consent has been obtained.  Description of procedure:  The patient was taken to the operating room and general anesthesia was induced.  The patient was placed in the dorsal lithotomy position, prepped and draped in the usual sterile fashion, and preoperative antibiotics were administered. A preoperative time-out was performed.   A 21 French scope was advanced per urethra into the bladder.  Notably, the patient had elevated bladder neck and due to his habitus had some difficulty getting the scope in but ultimately was successful.  Attention was then turned to the left ureteral orifice.  Again, due to J hooking and angulation, ended up using a wire in order to advance the open-ended ureteral catheter just within the UO.  A gentle retrograde pyelogram at this point in time was performed which showed some mild hydroureteronephrosis to  the level of the proximal ureter where there is a small filling defect and some tortuosity.  I then attempted to advance a sensor wire around the stone but with some difficulty at the level of the stone.  Using several techniques including advancing the open-ended just proximal to the stone, I was ultimately successful in getting the wire around the stone into the renal pelvis.  I then used a dual-lumen to introduce a Super Stiff wire which coiled just proximal to the stone.  A Cook 11/13 access sheath was then advanced to proximal to where the stone was located and the inner cannula and Super Stiff wire were removed.  This was done under fluoroscopic guidance.  Flexible digital ureteroscope was then brought in and at the level of the stone, I was unable to advance the scope.  Was unable to actually visualize the scope.  The ureter itself was quite narrow at this location.  I injected contrast through the beak of the scope with no extravasation or injury was appreciated.  The filling defect and some narrowing near the stone was appreciated.  And then attempted to use a railroad technique was not able to get my wire around the stone.  An angled Glidewire was then able to navigate around the stone ultimately but again I failed using this railroad technique.  Due to the relatively impacted stone and narrow ureter at this location, I made the decision to place a stent and return for staged procedure.  The scope and the access sheath were removed under direct visualization.  The safety wire was backloaded over rigid cystoscope and a 6 x 26 French double-J ureteral stent was advanced into the  kidney.  The wire was partially drawn till focal is noted within an upper pole calyx.  The wire was then fully withdrawn a full closed of the in the bladder.  The bladder was then drained.  Patient was then clean and dry, repositioned supine position, reversed myesthesia taken the PACU in stable condition.  Plan: Intraoperative  details were discussed with the patient's wife.  We will plan to return to the operating room in about 2 weeks for staged ureteroscopic procedure.  All questions were answered.  Hollice Espy, M.D.

## 2019-01-25 ENCOUNTER — Telehealth: Payer: Self-pay | Admitting: Urology

## 2019-01-25 NOTE — Telephone Encounter (Signed)
He does not have a kidney infection.  I told his wife that I had difficulty getting by the stone and what I did, there is some debris which may have been sediment.  He got antibiotics intraoperatively.  I do not think that he needs antibiotics right now and we will retest his urine for infection prior to his staged ureteroscopy.  If he has a fever, that he should give [place] a call and we can reevaluate.  He can restart all meds.  Help to hold them again prior to his staged procedure just like this time.  I put this on his discharge info, to resume all meds.  Hollice Espy, MD

## 2019-01-25 NOTE — Telephone Encounter (Signed)
Patient notified and verbalized understanding. 

## 2019-01-25 NOTE — Telephone Encounter (Signed)
Called pt. To discuss upcoming surgery instructions.  Patient stated he was told in post op he had a kidney infection but was not given any prescriptions and would like someone to call him and follow up with this. He would also like to talk to someone about restarting his medications that were stopped prior to surgery he ask the nurse in post op and was told to call the provider to get those instructions.

## 2019-01-31 ENCOUNTER — Other Ambulatory Visit: Payer: Self-pay

## 2019-01-31 ENCOUNTER — Other Ambulatory Visit: Payer: Medicare HMO

## 2019-01-31 DIAGNOSIS — N200 Calculus of kidney: Secondary | ICD-10-CM

## 2019-02-01 LAB — URINALYSIS, COMPLETE
Bilirubin, UA: NEGATIVE
Glucose, UA: NEGATIVE
Nitrite, UA: NEGATIVE
Specific Gravity, UA: 1.025 (ref 1.005–1.030)
Urobilinogen, Ur: 0.2 mg/dL (ref 0.2–1.0)
pH, UA: 6.5 (ref 5.0–7.5)

## 2019-02-01 LAB — MICROSCOPIC EXAMINATION: RBC, Urine: >30 /hpf — AB (ref 0–2)

## 2019-02-03 ENCOUNTER — Other Ambulatory Visit: Payer: Self-pay

## 2019-02-03 ENCOUNTER — Encounter: Payer: Self-pay | Admitting: Urology

## 2019-02-03 ENCOUNTER — Other Ambulatory Visit: Payer: Medicare HMO | Admitting: Urology

## 2019-02-03 ENCOUNTER — Other Ambulatory Visit
Admission: RE | Admit: 2019-02-03 | Discharge: 2019-02-03 | Disposition: A | Discharge status: Home / Self Care | Payer: Medicare HMO | Source: Ambulatory Visit | Attending: Urology | Admitting: Urology

## 2019-02-03 DIAGNOSIS — Z01812 Encounter for preprocedural laboratory examination: Secondary | ICD-10-CM | POA: Diagnosis present

## 2019-02-03 DIAGNOSIS — Z20828 Contact with and (suspected) exposure to other viral communicable diseases: Secondary | ICD-10-CM | POA: Insufficient documentation

## 2019-02-03 LAB — CULTURE, URINE COMPREHENSIVE

## 2019-02-03 LAB — SARS CORONAVIRUS 2 (TAT 6-24 HRS): SARS Coronavirus 2: NEGATIVE

## 2019-02-04 ENCOUNTER — Telehealth: Payer: Self-pay

## 2019-02-04 MED ORDER — LEVOFLOXACIN 500 MG PO TABS: 500.0000 mg | ORAL_TABLET | Freq: Every day | ORAL | 0 refills | Status: DC

## 2019-02-04 NOTE — Telephone Encounter (Signed)
-----   Message from Hollice Espy, MD sent at 02/04/2019  7:56 AM EDT ----- This patient is growing a low colony count of bacteria however given he has upcoming surgery I would rather be more cautious.  Please treat with Levaquin 500 mg starting today for a total of 7 days.  Hollice Espy, MD

## 2019-02-04 NOTE — Telephone Encounter (Signed)
Left pt a vmail notifying of results per DPR, abx sent to pharm

## 2019-02-07 ENCOUNTER — Ambulatory Visit: Payer: Medicare HMO

## 2019-02-07 ENCOUNTER — Ambulatory Visit: Payer: Medicare HMO | Admitting: Anesthesiology

## 2019-02-07 ENCOUNTER — Encounter
Admission: RE | Disposition: A | Discharge status: Home / Self Care | Payer: Self-pay | Source: Ambulatory Visit | Attending: Urology

## 2019-02-07 ENCOUNTER — Encounter: Payer: Self-pay | Admitting: *Deleted

## 2019-02-07 ENCOUNTER — Ambulatory Visit
Admission: RE | Admit: 2019-02-07 | Discharge: 2019-02-07 | Disposition: A | Discharge status: Home / Self Care | Payer: Medicare HMO | Source: Ambulatory Visit | Attending: Urology | Admitting: Urology

## 2019-02-07 DIAGNOSIS — G4730 Sleep apnea, unspecified: Secondary | ICD-10-CM | POA: Insufficient documentation

## 2019-02-07 DIAGNOSIS — M1990 Unspecified osteoarthritis, unspecified site: Secondary | ICD-10-CM | POA: Diagnosis not present

## 2019-02-07 DIAGNOSIS — Z6841 Body Mass Index (BMI) 40.0 and over, adult: Secondary | ICD-10-CM | POA: Diagnosis not present

## 2019-02-07 DIAGNOSIS — D4122 Neoplasm of uncertain behavior of left ureter: Secondary | ICD-10-CM | POA: Diagnosis not present

## 2019-02-07 DIAGNOSIS — Z87442 Personal history of urinary calculi: Secondary | ICD-10-CM | POA: Insufficient documentation

## 2019-02-07 DIAGNOSIS — I10 Essential (primary) hypertension: Secondary | ICD-10-CM | POA: Insufficient documentation

## 2019-02-07 DIAGNOSIS — N132 Hydronephrosis with renal and ureteral calculous obstruction: Secondary | ICD-10-CM | POA: Insufficient documentation

## 2019-02-07 DIAGNOSIS — N201 Calculus of ureter: Secondary | ICD-10-CM | POA: Diagnosis not present

## 2019-02-07 HISTORY — PX: CYSTOSCOPY/URETEROSCOPY/HOLMIUM LASER/STENT PLACEMENT: SHX6546

## 2019-02-07 HISTORY — PX: CYSTOSCOPY WITH BIOPSY: SHX5122

## 2019-02-07 HISTORY — PX: CYSTOSCOPY W/ RETROGRADES: SHX1426

## 2019-02-07 SURGERY — CYSTOSCOPY/URETEROSCOPY/HOLMIUM LASER/STENT PLACEMENT
Anesthesia: General | Site: Ureter | Laterality: Left

## 2019-02-07 MED ORDER — SUGAMMADEX SODIUM 500 MG/5ML IV SOLN
INTRAVENOUS | Status: DC | PRN
  Administered 2019-02-07: 500 mg via INTRAVENOUS

## 2019-02-07 MED ORDER — ROCURONIUM BROMIDE 100 MG/10ML IV SOLN
INTRAVENOUS | Status: DC | PRN
  Administered 2019-02-07: 40 mg via INTRAVENOUS
  Administered 2019-02-07: 20 mg via INTRAVENOUS
  Administered 2019-02-07: 10 mg via INTRAVENOUS

## 2019-02-07 MED ORDER — LIDOCAINE HCL (CARDIAC) PF 100 MG/5ML IV SOSY
PREFILLED_SYRINGE | INTRAVENOUS | Status: DC | PRN
  Administered 2019-02-07: 100 mg via INTRAVENOUS

## 2019-02-07 MED ORDER — GLYCOPYRROLATE 0.2 MG/ML IJ SOLN
INTRAMUSCULAR | Status: AC
  Filled 2019-02-07: qty 1

## 2019-02-07 MED ORDER — IOHEXOL 180 MG/ML  SOLN
INTRAMUSCULAR | Status: DC | PRN
  Administered 2019-02-07: 13:00:00 40 mL

## 2019-02-07 MED ORDER — ONDANSETRON HCL 4 MG/2ML IJ SOLN: 4.0000 mg | Freq: Once | INTRAMUSCULAR | Status: DC | PRN

## 2019-02-07 MED ORDER — DEXAMETHASONE SODIUM PHOSPHATE 10 MG/ML IJ SOLN
INTRAMUSCULAR | Status: DC | PRN
  Administered 2019-02-07: 10 mg via INTRAVENOUS

## 2019-02-07 MED ORDER — DEXTROSE 5 % IV SOLN
3.0000 g | Freq: Once | INTRAVENOUS | Status: AC
  Administered 2019-02-07: 3 g via INTRAVENOUS
  Filled 2019-02-07: qty 3000

## 2019-02-07 MED ORDER — LACTATED RINGERS IV SOLN
INTRAVENOUS | Status: DC
  Administered 2019-02-07 (×2): via INTRAVENOUS

## 2019-02-07 MED ORDER — FENTANYL CITRATE (PF) 100 MCG/2ML IJ SOLN
INTRAMUSCULAR | Status: AC
  Filled 2019-02-07: qty 2

## 2019-02-07 MED ORDER — SCOPOLAMINE 1 MG/3DAYS TD PT72
MEDICATED_PATCH | TRANSDERMAL | Status: AC
  Administered 2019-02-07: 12:00:00 1.5 mg via TRANSDERMAL
  Filled 2019-02-07: qty 1

## 2019-02-07 MED ORDER — MIDAZOLAM HCL 2 MG/2ML IJ SOLN
INTRAMUSCULAR | Status: DC | PRN
  Administered 2019-02-07: 2 mg via INTRAVENOUS

## 2019-02-07 MED ORDER — FENTANYL CITRATE (PF) 100 MCG/2ML IJ SOLN: 25.0000 ug | INTRAMUSCULAR | Status: DC | PRN

## 2019-02-07 MED ORDER — MIDAZOLAM HCL 2 MG/2ML IJ SOLN
INTRAMUSCULAR | Status: AC
  Filled 2019-02-07: qty 2

## 2019-02-07 MED ORDER — ONDANSETRON HCL 4 MG/2ML IJ SOLN
INTRAMUSCULAR | Status: DC | PRN
  Administered 2019-02-07 (×2): 4 mg via INTRAVENOUS

## 2019-02-07 MED ORDER — FAMOTIDINE 20 MG PO TABS
20.0000 mg | ORAL_TABLET | Freq: Once | ORAL | Status: AC
  Administered 2019-02-07: 12:00:00 20 mg via ORAL

## 2019-02-07 MED ORDER — FENTANYL CITRATE (PF) 100 MCG/2ML IJ SOLN
INTRAMUSCULAR | Status: DC | PRN
  Administered 2019-02-07: 50 ug via INTRAVENOUS

## 2019-02-07 MED ORDER — PROPOFOL 10 MG/ML IV BOLUS
INTRAVENOUS | Status: DC | PRN
  Administered 2019-02-07: 200 mg via INTRAVENOUS

## 2019-02-07 MED ORDER — SCOPOLAMINE 1 MG/3DAYS TD PT72
1.0000 | MEDICATED_PATCH | TRANSDERMAL | Status: DC
  Administered 2019-02-07: 12:00:00 1.5 mg via TRANSDERMAL

## 2019-02-07 MED ORDER — FAMOTIDINE 20 MG PO TABS
ORAL_TABLET | ORAL | Status: AC
  Administered 2019-02-07: 20 mg via ORAL
  Filled 2019-02-07: qty 1

## 2019-02-07 MED ORDER — SUCCINYLCHOLINE CHLORIDE 20 MG/ML IJ SOLN
INTRAMUSCULAR | Status: DC | PRN
  Administered 2019-02-07: 200 mg via INTRAVENOUS

## 2019-02-07 SURGICAL SUPPLY — 30 items
BAG DRAIN CYSTO-URO LG1000N (MISCELLANEOUS) ×3 IMPLANT
BASKET ZERO TIP 1.9FR (BASKET) ×2 IMPLANT
BRUSH SCRUB EZ 1% IODOPHOR (MISCELLANEOUS) ×3 IMPLANT
CATH URETL 5X70 OPEN END (CATHETERS) ×3 IMPLANT
CNTNR SPEC 2.5X3XGRAD LEK (MISCELLANEOUS) ×1
CONT SPEC 4OZ STER OR WHT (MISCELLANEOUS) ×2
CONTAINER SPEC 2.5X3XGRAD LEK (MISCELLANEOUS) IMPLANT
DRAPE UTILITY 15X26 TOWEL STRL (DRAPES) ×3 IMPLANT
DRSG TELFA 4X3 1S NADH ST (GAUZE/BANDAGES/DRESSINGS) ×2 IMPLANT
FIBER LASER TRAC TIP (UROLOGICAL SUPPLIES) ×2 IMPLANT
FORCEPS BIOP PIRANHA Y (CUTTING FORCEPS) ×2 IMPLANT
GLOVE BIO SURGEON STRL SZ 6.5 (GLOVE) ×2 IMPLANT
GLOVE BIO SURGEONS STRL SZ 6.5 (GLOVE) ×1
GOWN STRL REUS W/ TWL LRG LVL3 (GOWN DISPOSABLE) ×2 IMPLANT
GOWN STRL REUS W/TWL LRG LVL3 (GOWN DISPOSABLE) ×4
GUIDEWIRE GREEN .038 145CM (MISCELLANEOUS) ×2 IMPLANT
GUIDEWIRE STR DUAL SENSOR (WIRE) ×3 IMPLANT
INFUSOR MANOMETER BAG 3000ML (MISCELLANEOUS) ×3 IMPLANT
INTRODUCER DILATOR DOUBLE (INTRODUCER) ×2 IMPLANT
KIT TURNOVER CYSTO (KITS) ×3 IMPLANT
PACK CYSTO AR (MISCELLANEOUS) ×3 IMPLANT
SET CYSTO W/LG BORE CLAMP LF (SET/KITS/TRAYS/PACK) ×3 IMPLANT
SHEATH URETERAL 12FRX35CM (MISCELLANEOUS) ×2 IMPLANT
SOL .9 NS 3000ML IRR  AL (IV SOLUTION) ×2
SOL .9 NS 3000ML IRR UROMATIC (IV SOLUTION) ×1 IMPLANT
STENT URET 6FRX24 CONTOUR (STENTS) IMPLANT
STENT URET 6FRX26 CONTOUR (STENTS) ×2 IMPLANT
SURGILUBE 2OZ TUBE FLIPTOP (MISCELLANEOUS) ×3 IMPLANT
SYR 3ML 18GX1 1/2 (SYRINGE) ×2 IMPLANT
WATER STERILE IRR 1000ML POUR (IV SOLUTION) ×3 IMPLANT

## 2019-02-07 NOTE — Interval H&P Note (Signed)
History and Physical Interval Note:  02/07/2019 11:46 AM  Carlos Carroll  has presented today for surgery, with the diagnosis of LEFT URETERAL STONE.  The various methods of treatment have been discussed with the patient and family. After consideration of risks, benefits and other options for treatment, the patient has consented to  Procedure(s): CYSTOSCOPY/URETEROSCOPY/HOLMIUM LASER/STENT PLACEMENT (Left) as a surgical intervention.  The patient's history has been reviewed, patient examined, no change in status, stable for surgery.  I have reviewed the patient's chart and labs.  Questions were answered to the patient's satisfaction.    RRR CTAB  He returns today for staged procedure.  He previously attempted left ureteroscopy with due to habitus and anatomy, unable to advance scope successfully to stone.  As such, stent was placed he returns today for staged procedure.  Treated prophylactically, low colony count of bacteria in urine as precaution.  Hollice Espy

## 2019-02-07 NOTE — Transfer of Care (Signed)
Immediate Anesthesia Transfer of Care Note  Patient: Carlos Carroll  Procedure(s) Performed: CYSTOSCOPY/URETEROSCOPY/HOLMIUM LASER/STENT PLACEMENT (Left Ureter) CYSTOSCOPY WITH RETROGRADE PYELOGRAM (Left Ureter) CYSTOSCOPY WITH BIOPSY (Left Ureter)  Patient Location: PACU  Anesthesia Type:General  Level of Consciousness: awake  Airway & Oxygen Therapy: Patient Spontanous Breathing and Patient connected to face mask oxygen  Post-op Assessment: Report given to RN and Post -op Vital signs reviewed and stable  Post vital signs: Reviewed and stable  Last Vitals:  Vitals Value Taken Time  BP 138/75 02/07/19 1428  Temp 36.2 C 02/07/19 1428  Pulse 95 02/07/19 1428  Resp 16 02/07/19 1428  SpO2 95 % 02/07/19 1428    Last Pain:  Vitals:   02/07/19 1428  TempSrc: Temporal  PainSc: 0-No pain         Complications: No apparent anesthesia complications

## 2019-02-07 NOTE — Discharge Instructions (Signed)
You have a ureteral stent in place.  This is a tube that extends from your kidney to your bladder.  This may cause urinary bleeding, burning with urination, and urinary frequency.  Please call our office or present to the ED if you develop fevers >101 or pain which is not able to be controlled with oral pain medications.  You may be given either Flomax and/ or ditropan to help with bladder spasms and stent pain in addition to pain medications.    Your stent is on a string.  It is taped to the head of your penis.  On Friday morning, you may untaped the stent string and pulled gently until the entire stent is removed.  If you have any questions or concerns, please feel free to call our office.  [place] [address], [place], [place] [address] [phone]  AMBULATORY SURGERY  DISCHARGE INSTRUCTIONS   1) The drugs that you were given will stay in your system until tomorrow so for the next 24 hours you should not:  A) Drive an automobile B) Make any legal decisions C) Drink any alcoholic beverage   2) You may resume regular meals tomorrow.  Today it is better to start with liquids and gradually work up to solid foods.  You may eat anything you prefer, but it is better to start with liquids, then soup and crackers, and gradually work up to solid foods.   3) Please notify your doctor immediately if you have any unusual bleeding, trouble breathing, redness and pain at the surgery site, drainage, fever, or pain not relieved by medication.    4) Additional Instructions:        Please contact your physician with any problems or Same Day Surgery at [phone], Monday through Friday 6 am to 4 pm, or [place] at [facility] number at [phone].

## 2019-02-07 NOTE — Anesthesia Preprocedure Evaluation (Addendum)
Anesthesia Evaluation  Patient identified by MRN, date of birth, ID band Patient awake    Reviewed: Allergy & Precautions, NPO status , Patient's Chart, lab work & pertinent test results  History of Anesthesia Complications (+) PONV and history of anesthetic complications (sore throat, bleeding from nose)  Airway Mallampati: III       Dental   Pulmonary sleep apnea and Continuous Positive Airway Pressure Ventilation , neg COPD,           Cardiovascular hypertension, Pt. on medications (-) Past MI and (-) CHF (-) dysrhythmias (-) Valvular Problems/Murmurs     Neuro/Psych neg Seizures    GI/Hepatic Neg liver ROS, neg GERD  ,  Endo/Other  neg diabetesMorbid obesity  Renal/GU Renal disease (stones)     Musculoskeletal   Abdominal   Peds  Hematology   Anesthesia Other Findings   Reproductive/Obstetrics                            Anesthesia Physical Anesthesia Plan  ASA: III  Anesthesia Plan: General   Post-op Pain Management:    Induction: Intravenous  PONV Risk Score and Plan: 2  Airway Management Planned: Oral ETT  Additional Equipment:   Intra-op Plan:   Post-operative Plan:   Informed Consent: I have reviewed the patients History and Physical, chart, labs and discussed the procedure including the risks, benefits and alternatives for the proposed anesthesia with the patient or authorized representative who has indicated his/her understanding and acceptance.       Plan Discussed with:   Anesthesia Plan Comments:         Anesthesia Quick Evaluation

## 2019-02-07 NOTE — Anesthesia Post-op Follow-up Note (Signed)
Anesthesia QCDR form completed.        

## 2019-02-07 NOTE — Anesthesia Procedure Notes (Signed)
Procedure Name: Intubation Date/Time: 02/07/2019 12:23 PM Performed by: Nelda Marseille, CRNA Pre-anesthesia Checklist: Patient identified, Patient being monitored, Timeout performed, Emergency Drugs available and Suction available Patient Re-evaluated:Patient Re-evaluated prior to induction Oxygen Delivery Method: Circle system utilized Preoxygenation: Pre-oxygenation with 100% oxygen Induction Type: IV induction Ventilation: Mask ventilation without difficulty Laryngoscope Size: Mac, 3 and McGraph Grade View: Grade I Tube type: Oral Tube size: 7.5 mm Number of attempts: 1 Airway Equipment and Method: Stylet Placement Confirmation: ETT inserted through vocal cords under direct vision,  positive ETCO2 and breath sounds checked- equal and bilateral Secured at: 21 cm Tube secured with: Tape Dental Injury: Teeth and Oropharynx as per pre-operative assessment

## 2019-02-07 NOTE — Anesthesia Postprocedure Evaluation (Signed)
Anesthesia Post Note  Patient: Kaid Spohr  Procedure(s) Performed: CYSTOSCOPY/URETEROSCOPY/HOLMIUM LASER/STENT PLACEMENT (Left Ureter) CYSTOSCOPY WITH RETROGRADE PYELOGRAM (Left Ureter) CYSTOSCOPY WITH BIOPSY (Left Ureter)  Patient location during evaluation: PACU Anesthesia Type: General Level of consciousness: awake and alert Pain management: pain level controlled Vital Signs Assessment: post-procedure vital signs reviewed and stable Respiratory status: spontaneous breathing and respiratory function stable Cardiovascular status: stable Anesthetic complications: no     Last Vitals:  Vitals:   02/07/19 1355 02/07/19 1410  BP: (!) 163/70 (!) 152/80  Pulse: 96 95  Resp: 17 14  Temp:  (!) 36.3 C  SpO2: 91% 92%    Last Pain:  Vitals:   02/07/19 1410  PainSc: 0-No pain                 Zelma Mazariego K

## 2019-02-07 NOTE — Op Note (Signed)
Date of procedure: 02/07/19  Preoperative diagnosis:  1. Left proximal ureteral calculus  Postoperative diagnosis:  1. Same as above 2. Left proximal ureteral edema versus mass  Procedure: 1. Left ureteroscopy 2. Left retrograde pyelogram 3. Left ureteral stent exchange 4. Laser lithotripsy 5. Left retrograde pyelogram 6. Left proximal ureteral and renal pelvic biopsies  Surgeon: Hollice Espy, MD  Anesthesia: General  Complications: None  Intraoperative findings: Stone lodged in unchanged position within the proximal ureter.  Pushed into the upper pole calyx with endoscopic manipulation.  Area of presumed papillary edema concentric around the proximal ureter at the level of the stone as well as within the renal pelvis of the previous stent location, likely reactive but biopsied as precaution.  EBL: Minimal  Specimens: Left proximal ureter/renal pelvic biopsies, ureteral calculus  Drains: 6 x 26 French double-J ureteral stent on left, tether left in place  Indication: Carlos Carroll is a 68 y.o. patient with 13 mm left proximal ureteral calculus who returns today for staged procedure given previous inability to reach the stone.  After reviewing the management options for treatment, he elected to proceed with the above surgical procedure(s). We have discussed the potential benefits and risks of the procedure, side effects of the proposed treatment, the likelihood of the patient achieving the goals of the procedure, and any potential problems that might occur during the procedure or recuperation. Informed consent has been obtained.  Description of procedure:  The patient was taken to the operating room and general anesthesia was induced.  The patient was placed in the dorsal lithotomy position, prepped and draped in the usual sterile fashion, and preoperative antibiotics were administered. A preoperative time-out was performed.   A 21 French scope was advanced per urethra into the  bladder.  Attention was turned to left ureter for orifice from which a ureteral stent was seen emanating.  The distal stent of this coil was grasped using a stent grasper and brought to level the urethral meatus.  This was then cannulated using a sensor wire up to level of the kidney.  Notably, the stone could be seen in the proximal ureter on scout imaging.  A dual-lumen access sheath was introduced to the mid ureter and a second Super Stiff wire was advanced all the way up to level the kidney around the stone without difficulty.  The sensor wire was then snapped in place as a safety wire.  He is a 35 cm Cook access sheath to the proximal ureter distal to the level of the stone which advanced easily under fluoroscopic guidance.  The Super Stiff wire and inner cannula removed.  An 8 French dual-lumen digital ureteroscope was then advanced to the level of the stone which was seen partially embedded into some slightly raised papillary edematous change within the proximal ureter.  This was noted gently and able to push up into an upper pole calyx through which the scope passed easily.  A 200 m laser fiber was then brought in using settings of 0.2 J and 40 Hz, the stone was fragmented into very small pieces.  A few of these larger pieces were basket using 1.9 tipless nitinol basket.  The remainder of the stone was pulverized into tiny particles no larger than the typical laser fiber.  Notably, both in the area of the proximal ureter as well as a small area where presumably the stent was resting within the renal pelvis, there is some slight raised papillary change.  This likely represented reactive change however  given its mildly frondular appearance, I did use a Parona forceps to biopsy both the area within the renal pelvis as well as within the proximal ureter which had a remarkably similar appearance.  This was done as a precaution.  These were very small biopsies.  Finally, contrast was injected through the scope.   This created a roadmap of the kidneys to ensure that each every calyx have been directly visualized.  There is no significant residual stone fragment remaining.  The scope was then backed down the length of the ureter inspecting along the way.  The sheath was removed at the same time.  There were no ureteral injuries or additional fragments appreciated.  Finally, the safety wire was backloaded over rigid cystoscope.  A 6 x 26 French double-J ureteral stent was advanced over the wire up to level the kidney.  The wires partially drawn till focal is noted within the renal pelvis as well as a full coil within the bladder.  The stent string was left attached to the distal coil of the stent which was affixed to the patient's glans using Mastisol and Tegaderm.  The patient was then cleaned and dried, repositioned supine position, reversed anesthesia, taken the PACU in stable condition.  Plan: Patient will remove his own stent on Friday.  Follow-up stone analysis.  He will return in 4 weeks for renal ultrasound prior.  Expect his pathology will be benign but will call him if his pathology is abnormal.  Hollice Espy, M.D.

## 2019-02-09 LAB — SURGICAL PATHOLOGY

## 2019-02-21 LAB — CALCULI, WITH PHOTOGRAPH (CLINICAL LAB)
Calcium Oxalate Dihydrate: 70 %
Calcium Oxalate Monohydrate: 30 %
Weight Calculi: 3 mg

## 2019-03-07 ENCOUNTER — Other Ambulatory Visit: Payer: Self-pay

## 2019-03-07 ENCOUNTER — Ambulatory Visit
Admission: RE | Admit: 2019-03-07 | Discharge: 2019-03-07 | Disposition: A | Discharge status: Home / Self Care | Payer: Medicare HMO | Source: Ambulatory Visit | Attending: Urology | Admitting: Urology

## 2019-03-07 DIAGNOSIS — N201 Calculus of ureter: Secondary | ICD-10-CM | POA: Diagnosis present

## 2019-03-08 ENCOUNTER — Ambulatory Visit: Payer: Medicare HMO | Admitting: Urology

## 2019-03-08 ENCOUNTER — Encounter: Payer: Self-pay | Admitting: Urology

## 2019-03-08 VITALS — BP 142/77 | HR 93 | Wt 351.0 lb

## 2019-03-08 DIAGNOSIS — N201 Calculus of ureter: Secondary | ICD-10-CM | POA: Diagnosis not present

## 2019-03-08 NOTE — Progress Notes (Signed)
03/08/2019 1:27 PM   Carlos Carroll 10-08-1950 [id number]  Referring provider: Gayland Curry, MD [address] [place],  [place] [address]  Chief Complaint  Patient presents with  . Results    4wk w/RUS    HPI: 68 year old male who returns today following left ureteroscopy.    He initially presented with a 13 mm left proximal ureteral calculus.  He ultimately underwent a staged procedure due to difficulty accessing the stone due to anatomic issues.  Ultimately this was successful.  He remove his own stent following the procedure without difficulty.  Intraoperatively, a small amount of left renal pelvic mucosa was biopsied which had edematous-like appearance as a precaution.  This was consistent with reactive urothelium with focal erosion.  No dysplasia was appreciated.  He follows up today with follow-up renal ultrasound.  This shows resolution of his hydroureteronephrosis.  There is some debris in the left kidney likely stone fragmentation/dust material.  Prior to this episode, he passed the stone spontaneously 30 years ago.  Stone composition 70% calcium oxalate dihydrate, 30% calcium oxalate monohydrate.   PMH: Past Medical History:  Diagnosis Date  . Arthritis    KNEE JOINT  . Family history of adverse reaction to anesthesia    MOTHER LOST TASTE AFTER SURGERY  . History of kidney stones   . Hypertension   . Seasonal allergies   . Sleep apnea    CPAP    Surgical History: Past Surgical History:  Procedure Laterality Date  . COLONOSCOPY  2005  . COLONOSCOPY WITH PROPOFOL N/A 02/27/2015   Procedure: COLONOSCOPY WITH PROPOFOL;  Surgeon: Lucilla Lame, MD;  Location: [facility];  Service: Endoscopy;  Laterality: N/A;  . CYSTOSCOPY W/ RETROGRADES Left 01/24/2019   Procedure: CYSTOSCOPY WITH RETROGRADE PYELOGRAM;  Surgeon: Hollice Espy, MD;  Location: [facility];  Service: Urology;  Laterality: Left;  . CYSTOSCOPY W/ RETROGRADES Left 02/07/2019   Procedure:  CYSTOSCOPY WITH RETROGRADE PYELOGRAM;  Surgeon: Hollice Espy, MD;  Location: [facility];  Service: Urology;  Laterality: Left;  . CYSTOSCOPY WITH BIOPSY Left 02/07/2019   Procedure: CYSTOSCOPY WITH BIOPSY;  Surgeon: Hollice Espy, MD;  Location: [facility];  Service: Urology;  Laterality: Left;  . CYSTOSCOPY WITH STENT PLACEMENT Left 01/24/2019   Procedure: CYSTOSCOPY WITH STENT PLACEMENT;  Surgeon: Hollice Espy, MD;  Location: [facility];  Service: Urology;  Laterality: Left;  . CYSTOSCOPY/URETEROSCOPY/HOLMIUM LASER/STENT PLACEMENT Left 02/07/2019   Procedure: CYSTOSCOPY/URETEROSCOPY/HOLMIUM LASER/STENT PLACEMENT;  Surgeon: Hollice Espy, MD;  Location: [facility];  Service: Urology;  Laterality: Left;    Home Medications:  Allergies as of 03/08/2019   No Known Allergies     Medication List       Accurate as of March 08, 2019  1:27 PM. If you have any questions, ask your nurse or doctor.        STOP taking these medications   HYDROcodone-acetaminophen 5-325 MG tablet Commonly known as: Norco Stopped by: Hollice Espy, MD   levofloxacin 500 MG tablet Commonly known as: LEVAQUIN Stopped by: Hollice Espy, MD   ondansetron 4 MG tablet Commonly known as: Zofran Stopped by: Hollice Espy, MD   oxybutynin 5 MG tablet Commonly known as: DITROPAN Stopped by: Hollice Espy, MD   tamsulosin 0.4 MG Caps capsule Commonly known as: FLOMAX Stopped by: Hollice Espy, MD     TAKE these medications   aspirin EC 81 MG tablet Take 81 mg by mouth daily.   atorvastatin 40 MG tablet Commonly known as: LIPITOR Take 40 mg by  mouth daily.   diclofenac sodium 1 % Gel Commonly known as: VOLTAREN Apply 1 application topically 4 (four) times daily as needed (arthritis pain.).   FLAX SEED OIL PO Take 1,400 mg by mouth daily.   GLUCOSAMINE 1500 COMPLEX PO Take 1 tablet by mouth daily.   hydrochlorothiazide 25 MG tablet Commonly known as: HYDRODIURIL Take by mouth.   losartan  50 MG tablet Commonly known as: COZAAR Take 50 mg by mouth daily.   multivitamin with minerals Tabs tablet Take 1 tablet by mouth daily.       Allergies: No Known Allergies  Family History: No family history on file.  Social History:  reports that he has never smoked. He has never used smokeless tobacco. He reports that he does not drink alcohol or use drugs.  ROS: UROLOGY Frequent Urination?: No Hard to postpone urination?: No Burning/pain with urination?: No Get up at night to urinate?: No Leakage of urine?: No Urine stream starts and stops?: No Trouble starting stream?: No Do you have to strain to urinate?: No Blood in urine?: No Urinary tract infection?: No Sexually transmitted disease?: No Injury to kidneys or bladder?: No Painful intercourse?: No Weak stream?: No Erection problems?: No Penile pain?: No  Gastrointestinal Nausea?: No Vomiting?: No Indigestion/heartburn?: No Diarrhea?: No Constipation?: No  Constitutional Fever: No Night sweats?: No Weight loss?: No Fatigue?: No  Skin Skin rash/lesions?: No Itching?: No  Eyes Blurred vision?: No Double vision?: No  Ears/Nose/Throat Sore throat?: No Sinus problems?: No  Hematologic/Lymphatic Swollen glands?: No Easy bruising?: No  Cardiovascular Leg swelling?: No Chest pain?: No  Respiratory Cough?: No Shortness of breath?: No  Endocrine Excessive thirst?: No  Musculoskeletal Back pain?: No Joint pain?: No  Neurological Headaches?: No Dizziness?: No  Psychologic Depression?: No Anxiety?: No  Physical Exam: BP (!) 142/77   Pulse 93   Wt (!) 351 lb (159.2 kg)   BMI 51.83 kg/m   Constitutional:  Alert and oriented, No acute distress. HEENT:  AT, moist mucus membranes.  Trachea midline, no masses. Cardiovascular: No clubbing, cyanosis, or edema. Respiratory: Normal respiratory effort, no increased work of breathing. GI: Abdomen is soft, obese Skin: No rashes, bruises  or suspicious lesions. Neurologic: Grossly intact, no focal deficits, moving all 4 extremities. Psychiatric: Normal mood and affect.  Laboratory Data: Lab Results  Component Value Date   WBC 11.2 (H) 01/18/2019   HGB 16.3 01/18/2019   HCT 50.9 01/18/2019   MCV 88.5 01/18/2019   PLT 292 01/18/2019    Lab Results  Component Value Date   CREATININE 0.86 01/18/2019    Pertinent Imaging: Results for orders placed during the hospital encounter of 03/07/19  US RENAL   Narrative CLINICAL DATA:  Left ureteral calculus.  EXAM: RENAL / URINARY TRACT ULTRASOUND COMPLETE  COMPARISON:  None.  FINDINGS: Right Kidney:  Renal measurements: 12.4 x 7.7 x 6.6 cm = volume: 327.4 mL. Contains a 2.1 cm cyst in the upper pole. Several clustered echogenic foci are seen in the right kidney. A representative focus measures 7 mm. A few other scattered echogenic nonshadowing foci are noted.  Left Kidney:  Renal measurements: 12.9 x 7.4 x 6.4 cm = volume: 318 mL. Echogenicity within normal limits. No mass or hydronephrosis visualized.  Bladder:  Appears normal for degree of bladder distention.  IMPRESSION: 1. There is a 2 cm cyst in the right kidney. 2. Nonshadowing echogenic foci in the right kidney measuring up to 7 mm are identified. There is no definitive  correlate on the January 18, 2019 study. These could be stones which have developed in the short interval versus artifact. No hydronephrosis. 3. The left kidney is normal.  No hydronephrosis.   Electronically Signed   By: Dorise Bullion III M.D   On: 03/07/2019 16:06    Renal stone imaging were personally reviewed, agree with radiologic dilatation.  Echogenic foci likely debris from recent lithotripsy.  Assessment & Plan:    1. Left ureteral stone Status post staged left ureteroscopy  Renal ultrasound shows resolution of hydronephrosis which is reassuring  Some debris in the kidney-discussed with patient likely stone dust  material with other debris will likely resolve spontaneously  Stone composition reviewed with patient.  We discussed general stone prevention techniques including drinking plenty water with goal of producing 2.5 L urine daily, increased citric acid intake, avoidance of high oxalate containing foods, and decreased salt intake.  Information about dietary recommendations given today.  - Abdomen 1 view (KUB); Future   Return in about 1 year (around 03/07/2020) for KUB  with PA.  Hollice Espy, MD  [facility] [address] [place], [place] [address] [phone]

## 2019-05-31 ENCOUNTER — Ambulatory Visit: Payer: Medicare HMO | Attending: Internal Medicine

## 2019-05-31 DIAGNOSIS — Z20822 Contact with and (suspected) exposure to covid-19: Secondary | ICD-10-CM

## 2019-06-02 LAB — NOVEL CORONAVIRUS, NAA: SARS-CoV-2, NAA: DETECTED — AB

## 2019-08-15 DIAGNOSIS — N401 Enlarged prostate with lower urinary tract symptoms: Secondary | ICD-10-CM | POA: Insufficient documentation

## 2019-08-23 ENCOUNTER — Other Ambulatory Visit: Payer: Self-pay

## 2019-08-23 ENCOUNTER — Ambulatory Visit: Payer: Medicare Other | Admitting: Dermatology

## 2019-08-23 DIAGNOSIS — L720 Epidermal cyst: Secondary | ICD-10-CM

## 2019-08-23 DIAGNOSIS — D492 Neoplasm of unspecified behavior of bone, soft tissue, and skin: Secondary | ICD-10-CM

## 2019-08-23 MED ORDER — MUPIROCIN 2 % EX OINT: 1.0000 "application " | TOPICAL_OINTMENT | Freq: Every day | CUTANEOUS | 0 refills | Status: DC

## 2019-08-23 NOTE — Progress Notes (Signed)
   Follow-Up Visit   Subjective  Carlos Carroll is a 69 y.o. male who presents for the following: Cyst (Cyst vs other L lateral deltoid). Bump Under Skin His subcutaneous nodule is located over the DERM LESION LOCATION: L lateral deltoid. This has been present for 2 month(s). There has been swelling and pain. He does have a history of epidermal inclusion cysts. He does not have a history of lipomas.   The following portions of the chart were reviewed this encounter and updated as appropriate:     Review of Systems: No other skin or systemic complaints.  Objective  Well appearing patient in no apparent distress; mood and affect are within normal limits.  A focused examination was performed including L lateral deltoid. Relevant physical exam findings are noted in the Assessment and Plan.  Objective  L lateral deltoid: Cystic pap  Assessment & Plan  Neoplasm of skin L lateral deltoid  Skin excision  Lesion length (cm):  2.2 Lesion width (cm):  1.3 Margin per side (cm):  0.2 Total excision diameter (cm):  2.6 Informed consent: discussed and consent obtained   Timeout: patient name, date of birth, surgical site, and procedure verified   Procedure prep:  Patient was prepped and draped in usual sterile fashion Prep type:  Isopropyl alcohol and povidone-iodine Anesthesia: the lesion was anesthetized in a standard fashion   Anesthetic:  1% lidocaine w/ epinephrine 1-100,000 buffered w/ 8.4% NaHCO3 (13) Instrument used: #15 blade   Hemostasis achieved with: pressure   Hemostasis achieved with comment:  Electrocautery Outcome: patient tolerated procedure well with no complications   Dressing: Mupirocin.    Skin repair Complexity:  Complex Final length (cm):  3 Reason for type of repair: reduce tension to allow closure, reduce the risk of dehiscence, infection, and necrosis, reduce subcutaneous dead space and avoid a hematoma, allow closure of the large defect, preserve normal  anatomy, preserve normal anatomical and functional relationships and enhance both functionality and cosmetic results   Undermining: area extensively undermined   Undermining comment:  Undermining Defect 2.0 cm Subcutaneous layers (deep stitches):  Suture size:  2-0 Suture type: Vicryl (polyglactin 910)   Subcutaneous suture technique: Inverted Dermal. Fine/surface layer approximation (top stitches):  Suture size:  3-0 Suture type comment:  Nylon Stitches: simple running   Suture removal (days):  7 Hemostasis achieved with: suture and pressure Outcome: patient tolerated procedure well with no complications   Post-procedure details: sterile dressing applied and wound care instructions given   Dressing type: bandage and pressure dressing   Additional details:  Pt prefers to use triple antibiotic ointment that he has at home.  Specimen 1 - Surgical pathology Differential Diagnosis: Cyst vs other Check Margins: No Cystic pap 2.2 x 1.3cm  Return in about 1 week (around 08/30/2019) for Suture removal with Dr. Nehemiah Massed Nurse.

## 2019-08-23 NOTE — Patient Instructions (Signed)

## 2019-08-24 ENCOUNTER — Telehealth: Payer: Self-pay

## 2019-08-24 NOTE — Telephone Encounter (Signed)
Pt doing well after yesterday's surgery./sh 

## 2019-08-26 ENCOUNTER — Ambulatory Visit: Payer: Self-pay | Attending: Internal Medicine

## 2019-08-26 DIAGNOSIS — Z23 Encounter for immunization: Secondary | ICD-10-CM

## 2019-08-26 NOTE — Progress Notes (Signed)
   Covid-19 Vaccination Clinic  Name:  Kaleem Lores    MRN: [id number] DOB: [birth year]  08/26/2019  Mr. Onate was observed post Covid-19 immunization for 15 minutes without incident. He was provided with Vaccine Information Sheet and instruction to access the V-Safe system.   Mr. Kneece was instructed to call 911 with any severe reactions post vaccine: Marland Kitchen Difficulty breathing  . Swelling of face and throat  . A fast heartbeat  . A bad rash all over body  . Dizziness and weakness   Immunizations Administered    Name Date Dose VIS Date Route   Pfizer COVID-19 Vaccine 08/26/2019  8:11 AM 0.3 mL 05/20/2019 Intramuscular   Manufacturer: [place]   Lot: [id number]   [place]: [id number]

## 2019-08-30 ENCOUNTER — Other Ambulatory Visit: Payer: Self-pay

## 2019-08-30 ENCOUNTER — Telehealth: Payer: Self-pay

## 2019-08-30 ENCOUNTER — Ambulatory Visit (INDEPENDENT_AMBULATORY_CARE_PROVIDER_SITE_OTHER): Payer: Medicare Other | Admitting: Dermatology

## 2019-08-30 ENCOUNTER — Encounter: Payer: Self-pay | Admitting: Dermatology

## 2019-08-30 DIAGNOSIS — L720 Epidermal cyst: Secondary | ICD-10-CM

## 2019-08-30 NOTE — Telephone Encounter (Signed)
Biopsy results discussed with pt  

## 2019-08-30 NOTE — Patient Instructions (Signed)
After Suture Removal ° °1. After sutures are removed, the wound should be coated with an antibiotic ointment (eg. Polysporin, Bacitracin) and, if possible, kept covered with a Band-Aid or bandage for an additional 24 hours.  After that, no additional wound care is generally needed.  °2. It is alright to get the area wet. °3. If a skin cancer was removed, your skin should be re-examined in approximately three months. ° ° ° °

## 2019-08-30 NOTE — Progress Notes (Signed)
   Follow-Up Visit   Subjective  Carlos Carroll is a 69 y.o. male who presents for the following: suture removal/post op (patient did experience some pain and swelling a few days after surgery, but it has since improved. ).   The following portions of the chart were reviewed this encounter and updated as appropriate:     Review of Systems: No other skin or systemic complaints.  Objective  Well appearing patient in no apparent distress; mood and affect are within normal limits.  A focused examination was performed including L arm. Relevant physical exam findings are noted in the Assessment and Plan.  Objective  L lat deltoid: Healing excision site   Assessment & Plan  Epidermoid cyst L lat deltoid  Sutures removed, steri strips applied, discussed pathology results with patient.    Return in about 6 months (around 03/01/2020) for UBSE.   Tanya Nones, CMA, am acting as scribe for Sarina Ser, MD .

## 2019-08-30 NOTE — Progress Notes (Signed)
LMOVM please call here

## 2019-09-20 ENCOUNTER — Ambulatory Visit: Payer: Self-pay

## 2019-09-21 ENCOUNTER — Ambulatory Visit: Payer: Medicare Other | Attending: Internal Medicine

## 2019-09-21 DIAGNOSIS — Z23 Encounter for immunization: Secondary | ICD-10-CM

## 2019-09-21 NOTE — Progress Notes (Signed)
   Covid-19 Vaccination Clinic  Name:  Raziel Camire    MRN: [id number] DOB: 1950-12-01  09/21/2019  Mr. Santillana was observed post Covid-19 immunization for 15 minutes without incident. He was provided with Vaccine Information Sheet and instruction to access the V-Safe system.   Mr. Zanfardino was instructed to call 911 with any severe reactions post vaccine: Marland Kitchen Difficulty breathing  . Swelling of face and throat  . A fast heartbeat  . A bad rash all over body  . Dizziness and weakness   Immunizations Administered    Name Date Dose VIS Date Route   Pfizer COVID-19 Vaccine 09/21/2019 11:43 AM 0.3 mL 05/20/2019 Intramuscular   Manufacturer: [place]   Lot: [id number]   [place]: [id number]

## 2020-01-18 ENCOUNTER — Other Ambulatory Visit: Payer: Self-pay

## 2020-01-18 ENCOUNTER — Ambulatory Visit: Payer: Medicare Other | Admitting: Dermatology

## 2020-01-18 DIAGNOSIS — Z1283 Encounter for screening for malignant neoplasm of skin: Secondary | ICD-10-CM

## 2020-01-18 DIAGNOSIS — Z85820 Personal history of malignant melanoma of skin: Secondary | ICD-10-CM | POA: Diagnosis not present

## 2020-01-18 DIAGNOSIS — L814 Other melanin hyperpigmentation: Secondary | ICD-10-CM

## 2020-01-18 DIAGNOSIS — C44622 Squamous cell carcinoma of skin of right upper limb, including shoulder: Secondary | ICD-10-CM | POA: Diagnosis not present

## 2020-01-18 DIAGNOSIS — L821 Other seborrheic keratosis: Secondary | ICD-10-CM | POA: Diagnosis not present

## 2020-01-18 DIAGNOSIS — L570 Actinic keratosis: Secondary | ICD-10-CM

## 2020-01-18 DIAGNOSIS — D229 Melanocytic nevi, unspecified: Secondary | ICD-10-CM

## 2020-01-18 DIAGNOSIS — L578 Other skin changes due to chronic exposure to nonionizing radiation: Secondary | ICD-10-CM

## 2020-01-18 DIAGNOSIS — D492 Neoplasm of unspecified behavior of bone, soft tissue, and skin: Secondary | ICD-10-CM

## 2020-01-18 DIAGNOSIS — D1800 Hemangioma unspecified site: Secondary | ICD-10-CM

## 2020-01-18 NOTE — Patient Instructions (Signed)

## 2020-01-18 NOTE — Progress Notes (Signed)
Follow-Up Visit   Subjective  Carlos Carroll is a 69 y.o. male who presents for the following: Annual Exam (6 months f/u UBSE, Hx of Melanoma ). The patient presents for Total-Body Skin Exam (TBSE) for skin cancer screening and mole check.  The following portions of the chart were reviewed this encounter and updated as appropriate:  Tobacco  Allergies  Meds  Problems  Med Hx  Surg Hx  Fam Hx     Review of Systems:  No other skin or systemic complaints except as noted in HPI or Assessment and Plan.  Objective  Well appearing patient in no apparent distress; mood and affect are within normal limits.  A full examination was performed including scalp, head, eyes, ears, nose, lips, neck, chest, axillae, abdomen, back, buttocks, bilateral upper extremities, bilateral lower extremities, hands, feet, fingers, toes, fingernails, and toenails. All findings within normal limits unless otherwise noted below.  Objective  Right Antecubital elbow: Well healed scar with no evidence of recurrence, no lymphadenopathy.   Objective  Right mid dorsum forearm: 1.1 x 0.7 cm red papule   Objective  R ear: Erythematous thin papules/macules with gritty scale.    Assessment & Plan  History of melanoma Right Antecubital elbow Clear. Observe for recurrence. Call clinic for new or changing lesions.  Recommend regular skin exams, daily broad-spectrum spf 30+ sunscreen use, and photoprotection.   No lymphadenopathy Observe   Neoplasm of skin Right mid dorsum forearm  Skin / nail biopsy Type of biopsy: tangential   Informed consent: discussed and consent obtained   Patient was prepped and draped in usual sterile fashion: area prepped with alochol. Anesthesia: the lesion was anesthetized in a standard fashion   Anesthetic:  1% lidocaine w/ epinephrine 1-100,000 buffered w/ 8.4% NaHCO3 Instrument used: flexible razor blade   Hemostasis achieved with: pressure, aluminum chloride and  electrodesiccation   Outcome: patient tolerated procedure well   Post-procedure details: wound care instructions given   Post-procedure details comment:  Ointment and small bandage  Specimen 1 - Surgical pathology Differential Diagnosis: R/O BCC vs scar  Check Margins: No 1.1 x 0.7 cm red papule  AK (actinic keratosis) R ear  Destruction of lesion - R ear Complexity: simple   Destruction method: cryotherapy   Informed consent: discussed and consent obtained   Timeout:  patient name, date of birth, surgical site, and procedure verified Lesion destroyed using liquid nitrogen: Yes   Region frozen until ice ball extended beyond lesion: Yes   Outcome: patient tolerated procedure well with no complications   Post-procedure details: wound care instructions given     .Lentigines - Scattered tan macules - Discussed due to sun exposure - Benign, observe - Call for any changes  Seborrheic Keratoses - Stuck-on, waxy, tan-brown papules and plaques  - Discussed benign etiology and prognosis. - Observe - Call for any changes  Melanocytic Nevi - Tan-brown and/or pink-flesh-colored symmetric macules and papules - Benign appearing on exam today - Observation - Call clinic for new or changing moles - Recommend daily use of broad spectrum spf 30+ sunscreen to sun-exposed areas.   Hemangiomas - Red papules - Discussed benign nature - Observe - Call for any changes  Actinic Damage - diffuse scaly erythematous macules with underlying dyspigmentation - Recommend daily broad spectrum sunscreen SPF 30+ to sun-exposed areas, reapply every 2 hours as needed.  - Call for new or changing lesions.  Skin cancer screening performed today.  Return in about 6 months (around 07/20/2020) for  TBSE.  I, Marye Round, CMA, am acting as scribe for Sarina Ser, MD .  Documentation: I have reviewed the above documentation for accuracy and completeness, and I agree with the above.  Sarina Ser, MD

## 2020-01-23 ENCOUNTER — Encounter: Payer: Self-pay | Admitting: Dermatology

## 2020-01-24 ENCOUNTER — Telehealth: Payer: Self-pay

## 2020-01-24 NOTE — Telephone Encounter (Signed)
Patient advised of biopsy results scheduled for surgery.

## 2020-01-24 NOTE — Telephone Encounter (Signed)
-----   Message from Ralene Bathe, MD sent at 01/24/2020  9:46 AM EDT ----- Skin , right mid dorsum forearm POORLY DIFFERENTIATED SQUAMOUS CELL CARCINOMA, BASE INVOLVED  Cancer - SCC Poorly differentiated Schedule for surgery

## 2020-01-24 NOTE — Telephone Encounter (Signed)
Left message on voice mail for patient to return my call.

## 2020-01-31 ENCOUNTER — Other Ambulatory Visit: Payer: Self-pay | Admitting: Dermatology

## 2020-01-31 ENCOUNTER — Other Ambulatory Visit: Payer: Self-pay

## 2020-01-31 ENCOUNTER — Ambulatory Visit: Payer: Medicare Other | Admitting: Dermatology

## 2020-01-31 ENCOUNTER — Encounter: Payer: Self-pay | Admitting: Dermatology

## 2020-01-31 DIAGNOSIS — C44622 Squamous cell carcinoma of skin of right upper limb, including shoulder: Secondary | ICD-10-CM

## 2020-01-31 DIAGNOSIS — C4492 Squamous cell carcinoma of skin, unspecified: Secondary | ICD-10-CM

## 2020-01-31 MED ORDER — MUPIROCIN 2 % EX OINT: 1.0000 "application " | TOPICAL_OINTMENT | Freq: Every day | CUTANEOUS | 1 refills | Status: DC

## 2020-01-31 NOTE — Patient Instructions (Signed)

## 2020-01-31 NOTE — Progress Notes (Signed)
Follow-Up Visit   Subjective  Carlos Carroll is a 69 y.o. male who presents for the following: SCC bx proven 01/18/20 (R mid dorsum forearm, pt presents for excision).  The following portions of the chart were reviewed this encounter and updated as appropriate:  Tobacco  Allergies  Meds  Problems  Med Hx  Surg Hx  Fam Hx     Review of Systems:  No other skin or systemic complaints except as noted in HPI or Assessment and Plan.  Objective  Well appearing patient in no apparent distress; mood and affect are within normal limits.  A focused examination was performed including R arm. Relevant physical exam findings are noted in the Assessment and Plan.  Objective  Right mid dorsum forearm: Pink bx site 2.1 x 1.5cm No lymphadenopathy    Objective  Right mid dorsum forearm deep margin: Deep margin of excision site   Assessment & Plan  Squamous cell carcinoma of skin (2) Right mid dorsum forearm  Skin excision  Lesion length (cm):  2.1 Lesion width (cm):  1.5 Margin per side (cm):  0.2 Total excision diameter (cm):  2.5 Informed consent: discussed and consent obtained   Timeout: patient name, date of birth, surgical site, and procedure verified   Procedure prep:  Patient was prepped and draped in usual sterile fashion Prep type:  Isopropyl alcohol and povidone-iodine Anesthesia: the lesion was anesthetized in a standard fashion   Anesthesia comment:  8cc Anesthetic:  1% lidocaine w/ epinephrine 1-100,000 buffered w/ 8.4% NaHCO3 Instrument used: #15 blade   Hemostasis achieved with: pressure   Hemostasis achieved with comment:  Electrocautery Outcome: patient tolerated procedure well with no complications   Post-procedure details: sterile dressing applied and wound care instructions given   Dressing type: bandage and pressure dressing (Mupirocin)   Additional details:  Specimen tagged on proximal edge  Skin repair Complexity:  Intermediate Final length (cm):   3.2 Reason for type of repair: reduce tension to allow closure, reduce the risk of dehiscence, infection, and necrosis, reduce subcutaneous dead space and avoid a hematoma, allow closure of the large defect, preserve normal anatomy, preserve normal anatomical and functional relationships and enhance both functionality and cosmetic results   Undermining: edges could be approximated without difficulty and area extensively undermined   Undermining comment:  Undermining Defect 0.5cm Subcutaneous layers (deep stitches):  Suture size:  2-0 Suture type: Vicryl (polyglactin 910)   Stitches:  Buried vertical mattress Fine/surface layer approximation (top stitches):  Suture size:  3-0 Suture type: nylon   Stitches: horizontal mattress   Stitches comment:  Nylon Suture removal (days):  7 Hemostasis achieved with: suture and pressure Outcome: patient tolerated procedure well with no complications   Post-procedure details: sterile dressing applied and wound care instructions given   Dressing type: pressure dressing (Mupirocin)    mupirocin ointment (BACTROBAN) 2 %  Specimen 1 - Surgical pathology Differential Diagnosis: C44.622 SCC Check Margins: No Pink bx site 2.1 x 1.5cm [id number]  Right mid dorsum forearm deep margin  Specimen 2 - Surgical pathology Differential Diagnosis: C44.622 SCC Check Margins: yess Deep Margin [phone]  Bx proven Discussed the severity of this Poorly Differentiated SCC Start Mupirocin ointment qd to excision site with dressing changes.   Return in about 1 week (around 02/07/2020).  I, Othelia Pulling, RMA, am acting as scribe for Sarina Ser, MD .  Documentation: I have reviewed the above documentation for accuracy and completeness, and I agree with the above.  Sarina Ser,  MD   

## 2020-02-01 ENCOUNTER — Telehealth: Payer: Self-pay

## 2020-02-01 NOTE — Telephone Encounter (Signed)
Pt doing fine after yesterdays surgery./sh 

## 2020-02-07 ENCOUNTER — Other Ambulatory Visit: Payer: Self-pay

## 2020-02-07 ENCOUNTER — Ambulatory Visit (INDEPENDENT_AMBULATORY_CARE_PROVIDER_SITE_OTHER): Payer: Medicare Other | Admitting: Dermatology

## 2020-02-07 ENCOUNTER — Encounter: Payer: Self-pay | Admitting: Dermatology

## 2020-02-07 DIAGNOSIS — C4492 Squamous cell carcinoma of skin, unspecified: Secondary | ICD-10-CM

## 2020-02-07 DIAGNOSIS — Z4802 Encounter for removal of sutures: Secondary | ICD-10-CM

## 2020-02-07 DIAGNOSIS — C44622 Squamous cell carcinoma of skin of right upper limb, including shoulder: Secondary | ICD-10-CM

## 2020-02-07 NOTE — Patient Instructions (Signed)

## 2020-02-07 NOTE — Progress Notes (Signed)
   Follow-Up Visit   Subjective  Carlos Carroll is a 69 y.o. male who presents for the following: Suture / Staple Removal (suture removal, right mid dorsum forearm, SCC, poorly differentiated, margins free).  The following portions of the chart were reviewed this encounter and updated as appropriate: Tobacco  Allergies  Meds  Problems  Med Hx  Surg Hx  Fam Hx     Review of Systems: No other skin or systemic complaints except as noted in HPI or Assessment and Plan.   Objective  Well appearing patient in no apparent distress; mood and affect are within normal limits.  A focused examination was performed including right arm. Relevant physical exam findings are noted in the Assessment and Plan.  Objective  Right Mid Dorsum Forearm: Squamous Cell Carcinoma, poorly differentiated - status post excision - pathology shows margins free.  Wound healing well, no evidence of infection  Assessment & Plan  Squamous cell carcinoma of skin Right Mid Dorsum Forearm  Pt is doing fine without complaints.  Encounter for Removal of Sutures - Incision site at the right mid dorum forearm is clean, dry and intact - Wound cleansed, sutures removed, wound cleansed and steri strips applied.  - Discussed pathology results showing SCC, poorly differentiated, margins free.  - Patient advised to keep steri-strips dry until they fall off. - Scars remodel for a full year. - Once steri-strips fall off, patient can apply over-the-counter silicone scar cream each night to help with scar remodeling if desired. - Patient advised to call with any concerns or if they notice any new or changing lesions.   Other Related Medications mupirocin ointment (BACTROBAN) 2 %  Return in 5 months (on 07/19/2020).   IHarriett Sine, CMA, am acting as scribe for Sarina Ser, MD.  Documentation: I have reviewed the above documentation for accuracy and completeness, and I agree with the above.  Sarina Ser,  MD

## 2020-02-08 ENCOUNTER — Telehealth: Payer: Self-pay

## 2020-02-08 NOTE — Telephone Encounter (Signed)
Unable to leave a message. Phone line picked up then hung up.

## 2020-02-08 NOTE — Telephone Encounter (Signed)
-----   Message from Ralene Bathe, MD sent at 02/07/2020  6:45 PM EDT ----- 1. Skin (M), (A) right mid dorsum forearm, excision RESIDUAL SQUAMOUS CELL CARCINOMA, MARGINS FREE 2. Skin (M), (B) right mid dorsum forearm deep margin, excision FIBROADIPOSE TISSUE. NEGATIVE FOR SQUAMOUS CELL CARCINOMA  1&2- Cancer - SCC Margins free

## 2020-02-09 ENCOUNTER — Telehealth: Payer: Self-pay

## 2020-02-09 NOTE — Telephone Encounter (Signed)
Patient called and informed of biopsy results, patient verbalized understanding.  

## 2020-02-11 ENCOUNTER — Encounter: Payer: Self-pay | Admitting: Dermatology

## 2020-02-27 ENCOUNTER — Ambulatory Visit: Payer: Medicare Other | Admitting: Dermatology

## 2020-03-06 NOTE — Progress Notes (Signed)
03/07/2020 2:39 PM   Carlos Carroll 1950-11-18 [id number]  Referring provider: Gayland Curry, MD [address] [place],  [place] [address]  Chief Complaint  Patient presents with  . Nephrolithiasis    HPI: Carlos Carroll is a 69 y.o. male with nephrolithiasis who presents today for yearly follow up.  He underwent a staged left ureteroscopy in 01/2019 with Dr. Erlene Carroll for a 13 mm left proximal ureteral calculus.  He ultimately underwent a staged procedure due to difficulty accessing the stone due to anatomic issues.  Ultimately this was successful.  He remove his own stent following the procedure without difficulty.  Intraoperatively, a small amount of left renal pelvic mucosa was biopsied which had edematous-like appearance as a precaution.  This was consistent with reactive urothelium with focal erosion.  No dysplasia was appreciated.  He follows up today with follow-up renal ultrasound.  This shows resolution of his hydroureteronephrosis.  There is some debris in the left kidney likely stone fragmentation/dust material.  Prior to this episode, he passed the stone spontaneously 30 years ago.  Stone composition 70% calcium oxalate dihydrate, 30% calcium oxalate monohydrate.  He has not had any episodes of left-sided flank pain, gross hematuria or passage of fragments.  He states he has been increasing his water intake and drinking lemonade.  Patient denies any modifying or aggravating factors.  Patient denies any gross hematuria, dysuria or suprapubic/flank pain.  Patient denies any fevers, chills, nausea or vomiting.   He has been having episodes of urinary frequency and urgency 1-2 times monthly.  He is taking tamsulosin 0.4 mg daily.  PSA in June 2020 was 0.81.  UA benign.   KUB 5 mm left renal stone.  PMH: Past Medical History:  Diagnosis Date  . Arthritis    KNEE JOINT  . Dysplastic nevi 09/03/2017   Left mid back paraspinal. Moderate atypia, deep margin involved  .  Dysplastic nevi 12/22/2017   Left posterior waistline. Moderate to severe atypia, close to margin. Excised: 12/22/2017, margins free.  . Family history of adverse reaction to anesthesia    MOTHER LOST TASTE AFTER SURGERY  . History of kidney stones   . Hx of dysplastic nevus 06/18/2016   R ant side at costal margin, mild to moderate atypia  . Hypertension   . Melanoma ([place])    Right upper arm  . Seasonal allergies   . Sleep apnea    CPAP  . Squamous cell carcinoma in situ    left post shoulder, left forearm, left dorsum med forearm  . Squamous cell carcinoma of skin 09/02/2016   Left posterior shoulder. SCCis  . Squamous cell carcinoma of skin 12/08/2016   Left forearm. SCCis  . Squamous cell carcinoma of skin 07/05/2018   Left dorsum medial forearm. SCCis  . Squamous cell carcinoma of skin 01/18/2020   R mid dorsum forearm, excised 01/31/2020    Surgical History: Past Surgical History:  Procedure Laterality Date  . COLONOSCOPY  2005  . COLONOSCOPY WITH PROPOFOL N/A 02/27/2015   Procedure: COLONOSCOPY WITH PROPOFOL;  Surgeon: Lucilla Lame, MD;  Location: [facility];  Service: Endoscopy;  Laterality: N/A;  . CYSTOSCOPY W/ RETROGRADES Left 01/24/2019   Procedure: CYSTOSCOPY WITH RETROGRADE PYELOGRAM;  Surgeon: Carlos Espy, MD;  Location: [facility];  Service: Urology;  Laterality: Left;  . CYSTOSCOPY W/ RETROGRADES Left 02/07/2019   Procedure: CYSTOSCOPY WITH RETROGRADE PYELOGRAM;  Surgeon: Carlos Espy, MD;  Location: [facility];  Service: Urology;  Laterality: Left;  .  CYSTOSCOPY WITH BIOPSY Left 02/07/2019   Procedure: CYSTOSCOPY WITH BIOPSY;  Surgeon: Carlos Espy, MD;  Location: [facility];  Service: Urology;  Laterality: Left;  . CYSTOSCOPY WITH STENT PLACEMENT Left 01/24/2019   Procedure: CYSTOSCOPY WITH STENT PLACEMENT;  Surgeon: Carlos Espy, MD;  Location: [facility];  Service: Urology;  Laterality: Left;  . CYSTOSCOPY/URETEROSCOPY/HOLMIUM LASER/STENT PLACEMENT Left  02/07/2019   Procedure: CYSTOSCOPY/URETEROSCOPY/HOLMIUM LASER/STENT PLACEMENT;  Surgeon: Carlos Espy, MD;  Location: [facility];  Service: Urology;  Laterality: Left;    Home Medications:  Allergies as of 03/07/2020   No Known Allergies     Medication List       Accurate as of March 07, 2020  2:39 PM. If you have any questions, ask your nurse or doctor.        aspirin EC 81 MG tablet Take 81 mg by mouth daily.   atorvastatin 40 MG tablet Commonly known as: LIPITOR Take 40 mg by mouth daily.   carvedilol 6.25 MG tablet Commonly known as: COREG Take 6.25 mg by mouth 2 (two) times daily.   diclofenac sodium 1 % Gel Commonly known as: VOLTAREN Apply 1 application topically 4 (four) times daily as needed (arthritis pain.).   FLAX SEED OIL PO Take 1,400 mg by mouth daily.   GLUCOSAMINE 1500 COMPLEX PO Take 1 tablet by mouth daily.   hydrochlorothiazide 25 MG tablet Commonly known as: HYDRODIURIL Take by mouth.   losartan 50 MG tablet Commonly known as: COZAAR Take 50 mg by mouth daily.   multivitamin with minerals Tabs tablet Take 1 tablet by mouth daily.   mupirocin ointment 2 % Commonly known as: BACTROBAN Apply 1 application topically daily. With dressing changes   mupirocin ointment 2 % Commonly known as: BACTROBAN Apply 1 application topically daily. Qd to excision site   tamsulosin 0.4 MG Caps capsule Commonly known as: FLOMAX Take 0.4 mg by mouth daily.       Allergies: No Known Allergies  Family History: No family history on file.  Social History:  reports that he has never smoked. He has never used smokeless tobacco. He reports that he does not drink alcohol and does not use drugs.  ROS: For pertinent review of systems please refer to history of present illness  Physical Exam: BP (!) 153/78 (BP Location: Left Arm, Patient Position: Sitting, Cuff Size: Normal)   Pulse 96   Ht 5\' 9"  (1.753 m)   Wt (!) 352 lb (159.7 kg)   BMI 51.98  kg/m   Constitutional:  Alert and oriented, No acute distress. HEENT: [place] AT, moist mucus membranes.  Trachea midline, no masses. Cardiovascular: No clubbing, cyanosis, or edema. Respiratory: Normal respiratory effort, no increased work of breathing. GI: Abdomen is soft, obese Skin: No rashes, bruises or suspicious lesions. Neurologic: Grossly intact, no focal deficits, moving all 4 extremities. Psychiatric: Normal mood and affect.  Laboratory Data: Urinalysis Component     Latest Ref Rng & Units 03/07/2020  Specific Gravity, UA     1.005 - 1.030 >1.030 (H)  pH, UA     5.0 - 7.5 5.5  Color, UA     Yellow Yellow  Appearance Ur     Clear Clear  Leukocytes,UA     Negative Negative  Protein,UA     Negative/Trace Negative  Glucose, UA     Negative Negative  Ketones, UA     Negative Trace (A)  RBC, UA     Negative Trace (A)  Bilirubin, UA  Negative Negative  Urobilinogen, Ur     0.2 - 1.0 mg/dL 0.2  Nitrite, UA     Negative Negative  Microscopic Examination      See below:   Component     Latest Ref Rng & Units 03/07/2020  WBC, UA     0 - 5 /hpf 0-5  RBC     0 - 2 /hpf 0-2  Epithelial Cells (non renal)     0 - 10 /hpf None seen  Casts     None seen /lpf Present (A)  Cast Type     N/A Hyaline casts  Bacteria, UA     None seen/Few Few    Lab Results  Component Value Date   WBC 11.2 (H) 01/18/2019   HGB 16.3 01/18/2019   HCT 50.9 01/18/2019   MCV 88.5 01/18/2019   PLT 292 01/18/2019    Lab Results  Component Value Date   CREATININE 0.86 01/18/2019    Pertinent Imaging: CLINICAL DATA:  Follow-up on kidney stones.  No current complaints.  EXAM: ABDOMEN - 1 VIEW  COMPARISON:  CT 01/18/2019  FINDINGS: Tiny 2 mm stone demonstrated over the left renal lower pole, correlating with stone seen on the prior CT. No ureteral stones are identified. Calcified granulomas in the spleen. Scattered gas and stool in the colon. No small or large bowel  distention. Degenerative changes in the spine and hips.  IMPRESSION: Tiny stone demonstrated over the left renal lower pole. No ureteral stones identified.   Electronically Signed   By: Lucienne Capers M.D.   On: 03/08/2020 23:32 I have independently reviewed the films.  See HPI.    Assessment & Plan:    1. Left renal stone 5 mm left renal stone is noted on today's KUB As patient is asymptomatic, we will continue to monitor the stone He will return in 1 year for KUB If the stone should migrate to the ureter or increase in size, we would consider ureteroscopy and if he will lose weight he may be a candidate for ESWL - Abdomen 1 view (KUB); Future  2. Prostate cancer screening Patient states that he has a PSA drawn every year by his PCP, but he has not had a digital rectal exam in quite some time I advised him that about 25% of prostate cancers are found only on digital rectal exam as they do not change the PSA value and therefore can be missed, so it is recommended to have the PSA and the rectal exam for prostate cancer screening purposes He defers exam today with the understanding of a possible missed prostate cancer  Return in about 1 year (around 03/07/2021) for KUB and office visit .  Zara Council, PA-C  [facility] [address], [place], [place] [address] [phone]  I spent 15 minutes on the day of the encounter to include pre-visit record review, face-to-face time with the patient, and post-visit ordering of tests.

## 2020-03-07 ENCOUNTER — Ambulatory Visit
Admission: RE | Admit: 2020-03-07 | Discharge: 2020-03-07 | Disposition: A | Discharge status: Home / Self Care | Payer: Medicare Other | Source: Ambulatory Visit | Attending: Urology | Admitting: Urology

## 2020-03-07 ENCOUNTER — Other Ambulatory Visit: Payer: Self-pay

## 2020-03-07 ENCOUNTER — Ambulatory Visit
Admission: RE | Admit: 2020-03-07 | Discharge: 2020-03-07 | Disposition: A | Discharge status: Home / Self Care | Payer: Medicare Other | Attending: Urology | Admitting: Urology

## 2020-03-07 ENCOUNTER — Ambulatory Visit: Payer: Medicare Other | Admitting: Urology

## 2020-03-07 ENCOUNTER — Encounter: Payer: Self-pay | Admitting: Urology

## 2020-03-07 VITALS — BP 153/78 | HR 96 | Ht 69.0 in | Wt 352.0 lb

## 2020-03-07 DIAGNOSIS — N201 Calculus of ureter: Secondary | ICD-10-CM | POA: Diagnosis present

## 2020-03-07 DIAGNOSIS — Z125 Encounter for screening for malignant neoplasm of prostate: Secondary | ICD-10-CM

## 2020-03-08 LAB — MICROSCOPIC EXAMINATION: Epithelial Cells (non renal): NONE SEEN /hpf (ref 0–10)

## 2020-03-08 LAB — URINALYSIS, COMPLETE
Bilirubin, UA: NEGATIVE
Glucose, UA: NEGATIVE
Leukocytes,UA: NEGATIVE
Nitrite, UA: NEGATIVE
Protein,UA: NEGATIVE
Specific Gravity, UA: >1.03 — ABNORMAL HIGH (ref 1.005–1.030)
Urobilinogen, Ur: 0.2 mg/dL (ref 0.2–1.0)
pH, UA: 5.5 (ref 5.0–7.5)

## 2020-04-09 ENCOUNTER — Ambulatory Visit (INDEPENDENT_AMBULATORY_CARE_PROVIDER_SITE_OTHER): Payer: Medicare Other | Admitting: Internal Medicine

## 2020-04-09 VITALS — BP 144/93 | HR 91 | Ht 69.0 in | Wt 354.0 lb

## 2020-04-09 DIAGNOSIS — Z9989 Dependence on other enabling machines and devices: Secondary | ICD-10-CM

## 2020-04-09 DIAGNOSIS — Z7189 Other specified counseling: Secondary | ICD-10-CM | POA: Diagnosis not present

## 2020-04-09 DIAGNOSIS — G4761 Periodic limb movement disorder: Secondary | ICD-10-CM | POA: Diagnosis not present

## 2020-04-09 DIAGNOSIS — G4733 Obstructive sleep apnea (adult) (pediatric): Secondary | ICD-10-CM

## 2020-04-09 NOTE — Progress Notes (Signed)
[facility] [place], [place] [address]  Pulmonary Sleep Medicine   Office Visit Note  Patient Name: Carlos Carroll DOB: 1951/03/11 MRN [id number]    Chief Complaint: Obstructive Sleep Apnea visit  Brief History:  Kojo is seen today for initial consultation The patient has a 22 year history of sleep apnea. He recently received a replacement CPAP Patient is using PAP nightly.  The patient feels  after sleeping with PAP.  He complains that he is always waking hourly due to problems with mask leak. The patient reports benefiting from PAP use.He goes to bed around 10-10:30 p.m. and has to be up at 4:45 a.m. Reported sleepiness is  worse and the Epworth Sleepiness Score is 16 out of 24. He has arthritis and has to take ibuprofen to sleep. The patient does take naps. The patient complains of the following: he can't get any mask to stay in place.  The compliance download shows excellent  compliance with an average use time of 6.9hours. The AHI is 0.8  The patient does not complain of limb movements disrupting sleep.  ROS  General: (-) fever, (-) chills, (-) night sweat Nose and Sinuses: (-) nasal stuffiness or itchiness, (-) postnasal drip, (-) nosebleeds, (-) sinus trouble. Mouth and Throat: (-) sore throat, (-) hoarseness. Neck: (-) swollen glands, (-) enlarged thyroid, (-) neck pain. Respiratory: - cough, - shortness of breath, - wheezing. Neurologic: - numbness, - tingling. Psychiatric: - anxiety, - depression   Current Medication: Outpatient Encounter Medications as of 04/09/2020  Medication Sig  . aspirin EC 81 MG tablet Take 81 mg by mouth daily.  Marland Kitchen atorvastatin (LIPITOR) 40 MG tablet Take 40 mg by mouth daily.  . carvedilol (COREG) 6.25 MG tablet Take 6.25 mg by mouth 2 (two) times daily.  . diclofenac sodium (VOLTAREN) 1 % GEL Apply 1 application topically 4 (four) times daily as needed (arthritis pain.).  Marland Kitchen Flaxseed, Linseed, (FLAX SEED OIL PO)  Take 1,400 mg by mouth daily.   . Glucosamine-Chondroit-Vit C-Mn (GLUCOSAMINE 1500 COMPLEX PO) Take 1 tablet by mouth daily.   . hydrochlorothiazide (HYDRODIURIL) 25 MG tablet Take by mouth.  . losartan (COZAAR) 50 MG tablet Take 50 mg by mouth daily.   . Multiple Vitamin (MULTIVITAMIN WITH MINERALS) TABS tablet Take 1 tablet by mouth daily.  . mupirocin ointment (BACTROBAN) 2 % Apply 1 application topically daily. With dressing changes (Patient not taking: Reported on 03/07/2020)  . mupirocin ointment (BACTROBAN) 2 % Apply 1 application topically daily. Qd to excision site (Patient not taking: Reported on 03/07/2020)  . tamsulosin (FLOMAX) 0.4 MG CAPS capsule Take 0.4 mg by mouth daily.   No facility-administered encounter medications on file as of 04/09/2020.    Surgical History: Past Surgical History:  Procedure Laterality Date  . COLONOSCOPY  2005  . COLONOSCOPY WITH PROPOFOL N/A 02/27/2015   Procedure: COLONOSCOPY WITH PROPOFOL;  Surgeon: Lucilla Lame, MD;  Location: [facility];  Service: Endoscopy;  Laterality: N/A;  . CYSTOSCOPY W/ RETROGRADES Left 01/24/2019   Procedure: CYSTOSCOPY WITH RETROGRADE PYELOGRAM;  Surgeon: Hollice Espy, MD;  Location: [facility];  Service: Urology;  Laterality: Left;  . CYSTOSCOPY W/ RETROGRADES Left 02/07/2019   Procedure: CYSTOSCOPY WITH RETROGRADE PYELOGRAM;  Surgeon: Hollice Espy, MD;  Location: [facility];  Service: Urology;  Laterality: Left;  . CYSTOSCOPY WITH BIOPSY Left 02/07/2019   Procedure: CYSTOSCOPY WITH BIOPSY;  Surgeon: Hollice Espy, MD;  Location: [facility];  Service: Urology;  Laterality: Left;  .  CYSTOSCOPY WITH STENT PLACEMENT Left 01/24/2019   Procedure: CYSTOSCOPY WITH STENT PLACEMENT;  Surgeon: Hollice Espy, MD;  Location: [facility];  Service: Urology;  Laterality: Left;  . CYSTOSCOPY/URETEROSCOPY/HOLMIUM LASER/STENT PLACEMENT Left 02/07/2019   Procedure: CYSTOSCOPY/URETEROSCOPY/HOLMIUM LASER/STENT PLACEMENT;  Surgeon: Hollice Espy, MD;  Location: [facility];  Service: Urology;  Laterality: Left;    Medical History: Past Medical History:  Diagnosis Date  . Arthritis    KNEE JOINT  . Dysplastic nevi 09/03/2017   Left mid back paraspinal. Moderate atypia, deep margin involved  . Dysplastic nevi 12/22/2017   Left posterior waistline. Moderate to severe atypia, close to margin. Excised: 12/22/2017, margins free.  . Family history of adverse reaction to anesthesia    MOTHER LOST TASTE AFTER SURGERY  . History of kidney stones   . Hx of dysplastic nevus 06/18/2016   R ant side at costal margin, mild to moderate atypia  . Hypertension   . Melanoma ([place])    Right upper arm  . Seasonal allergies   . Sleep apnea    CPAP  . Squamous cell carcinoma in situ    left post shoulder, left forearm, left dorsum med forearm  . Squamous cell carcinoma of skin 09/02/2016   Left posterior shoulder. SCCis  . Squamous cell carcinoma of skin 12/08/2016   Left forearm. SCCis  . Squamous cell carcinoma of skin 07/05/2018   Left dorsum medial forearm. SCCis  . Squamous cell carcinoma of skin 01/18/2020   R mid dorsum forearm, excised 01/31/2020    Family History: Non contributory to the present illness  Social History: Social History   Socioeconomic History  . Marital status: Married    Spouse name: Not on file  . Number of children: Not on file  . Years of education: Not on file  . Highest education level: Not on file  Occupational History  . Not on file  Tobacco Use  . Smoking status: Never Smoker  . Smokeless tobacco: Never Used  Vaping Use  . Vaping Use: Never used  Substance and Sexual Activity  . Alcohol use: No  . Drug use: Never  . Sexual activity: Not on file  Other Topics Concern  . Not on file  Social History Narrative  . Not on file   Social Determinants of Health   Financial Resource Strain:   . Difficulty of Paying Living Expenses: Not on file  Food Insecurity:   . Worried About Paediatric nurse in the Last Year: Not on file  . Ran Out of Food in the Last Year: Not on file  Transportation Needs:   . Lack of Transportation (Medical): Not on file  . Lack of Transportation (Non-Medical): Not on file  Physical Activity:   . Days of Exercise per Week: Not on file  . Minutes of Exercise per Session: Not on file  Stress:   . Feeling of Stress : Not on file  Social Connections:   . Frequency of Communication with Friends and Family: Not on file  . Frequency of Social Gatherings with Friends and Family: Not on file  . Attends Religious Services: Not on file  . Active Member of Clubs or Organizations: Not on file  . Attends Archivist Meetings: Not on file  . Marital Status: Not on file  Intimate Partner Violence:   . Fear of Current or Ex-Partner: Not on file  . Emotionally Abused: Not on file  . Physically Abused: Not on file  . Sexually Abused:  Not on file    Vital Signs: Blood pressure (!) 144/93, pulse 91, height 5\' 9"  (1.753 m), weight (!) 354 lb (160.6 kg), SpO2 95 %.  Examination: General Appearance: The patient is well-developed, well-nourished, and in no distress. Neck Circumference: 16 Skin: Gross inspection of skin unremarkable. Head: normocephalic, no gross deformities. Eyes: no gross deformities noted. ENT: ears appear grossly normal Neurologic: Alert and oriented. No involuntary movements.    EPWORTH SLEEPINESS SCALE:  Scale:  (0)= no chance of dozing; (1)= slight chance of dozing; (2)= moderate chance of dozing; (3)= high chance of dozing  Chance  Situtation    Sitting and reading: 3    Watching TV: 2    Sitting Inactive in public: 2    As a passenger in car: 2      Lying down to rest: 3    Sitting and talking: 1    Sitting quielty after lunch: 3    In a car, stopped in traffic: 0   TOTAL SCORE:   16 out of 24    SLEEP STUDIES:  1. PSG 12/02/19 AHI 52 SpO19min 78%   CPAP COMPLIANCE DATA:  Date Range:  03/10/20-04/08/20  Average Daily Use: 6.9 hours  Median Use: 6.7  Compliance for > 4 Hours: 100%  AHI: 0.8 respiratory events per hour  Days Used: 30/30  Mask Leak: 19  95th Percentile Pressure: 11         LABS: Recent Results (from the past 2160 hour(s))  Urinalysis, Complete     Status: Abnormal   Collection Time: 03/07/20  1:49 PM  Result Value Ref Range   Specific Gravity, UA >1.030 (H) 1.005 - 1.030   pH, UA 5.5 5.0 - 7.5   Color, UA Yellow Yellow   Appearance Ur Clear Clear   Leukocytes,UA Negative Negative   Protein,UA Negative Negative/Trace   Glucose, UA Negative Negative   Ketones, UA Trace (A) Negative   RBC, UA Trace (A) Negative   Bilirubin, UA Negative Negative   Urobilinogen, Ur 0.2 0.2 - 1.0 mg/dL   Nitrite, UA Negative Negative   Microscopic Examination See below:   Microscopic Examination     Status: Abnormal   Collection Time: 03/07/20  1:49 PM   Urine  Result Value Ref Range   WBC, UA 0-5 0 - 5 /hpf   RBC 0-2 0 - 2 /hpf   Epithelial Cells (non renal) None seen 0 - 10 /hpf   Casts Present (A) None seen /lpf   Cast Type Hyaline casts N/A   Bacteria, UA Few None seen/Few    Radiology: Abdomen 1 view (KUB)  Result Date: 03/08/2020 CLINICAL DATA:  Follow-up on kidney stones.  No current complaints. EXAM: ABDOMEN - 1 VIEW COMPARISON:  CT 01/18/2019 FINDINGS: Tiny 2 mm stone demonstrated over the left renal lower pole, correlating with stone seen on the prior CT. No ureteral stones are identified. Calcified granulomas in the spleen. Scattered gas and stool in the colon. No small or large bowel distention. Degenerative changes in the spine and hips. IMPRESSION: Tiny stone demonstrated over the left renal lower pole. No ureteral stones identified. Electronically Signed   By: Lucienne Capers M.D.   On: 03/08/2020 23:32    No results found.  No results found.    Assessment and Plan: Patient Active Problem List   Diagnosis Date Noted  .  Hyperlipidemia 01/19/2019  . Hx of melanoma of skin 01/19/2019  . Hypertension 01/19/2019  . OSA on CPAP 01/19/2019  .  Primary osteoarthritis of right knee 05/10/2018  . Abnormal laboratory test 03/01/2018  . BMI 50.0-59.9, adult ([place]) 03/26/2016  . Special screening for malignant neoplasms, colon   . Diverticulosis of large intestine without diverticulitis   . Allergic rhinitis 10/12/2012      The patient does tolerate PAP and reports significant benefit from PAP use. He complains of poor mask fit with any mask that he has tried. He may benefit from trying the P10 or similar mask. Mask fit was reviewed. He also had very frequent PLMS in his sleep studies and this may be contributing to his sleep disruption. Iron and ferritin studies are recommended. The patient was r advised to increase time in bed. He may benefit from trying hair clips on his head gear.CBTi was also recommended. Compliance is excellent the apnea is well controlled.  1. OSA- continue excellent compliance 2. CPAP couseling-Discussed importance of adequate CPAP use as well as proper care and cleaning techniques of machine and all supplies. 3. PLMD-Order placed for iron and ferritin studies, will adjust plan of care as needed 4. Morbid obesity-Discussed the importance of weight management through healthy eating and daily exercise as tolerated. Discussed the negative effects obesity has on pulmonary health, cardiac health as well as overall general health and well being.  General Counseling: I have discussed the findings of the evaluation and examination with [facility].  I have also discussed any further diagnostic evaluation thatmay be needed or ordered today. Pamela verbalizes understanding of the findings of todays visit. We also reviewed his medications today and discussed drug interactions and side effects including but not limited excessive drowsiness and altered mental states. We also discussed that there is always a risk not  just to him but also people around him. he has been encouraged to call the office with any questions or concerns that should arise related to todays visit.  Orders Placed This Encounter  Procedures  . Fe+TIBC+Fer     This patient was seen by Luiz Ochoa, AGNP-C in collaboration with Dr. Devona Konig as a part of collaborative care agreement.   I have personally obtained a history, examined the patient, evaluated laboratory and imaging results, formulated the assessment and plan and placed orders.   Richelle Ito Saunders Glance, PhD, FAASM  Diplomate, American Board of Sleep Medicine    Allyne Gee, MD [facility] Diplomate ABMS Pulmonary and Critical Care Medicine Sleep medicine

## 2020-04-09 NOTE — Patient Instructions (Signed)

## 2020-04-10 LAB — IRON,TIBC AND FERRITIN PANEL
Ferritin: 113 ng/mL (ref 30–400)
Iron Saturation: 12 % — ABNORMAL LOW (ref 15–55)
Iron: 38 ug/dL (ref 38–169)
Total Iron Binding Capacity: 316 ug/dL (ref 250–450)
UIBC: 278 ug/dL (ref 111–343)

## 2020-04-12 ENCOUNTER — Other Ambulatory Visit: Payer: Self-pay | Admitting: Hospice and Palliative Medicine

## 2020-04-30 ENCOUNTER — Ambulatory Visit (INDEPENDENT_AMBULATORY_CARE_PROVIDER_SITE_OTHER): Payer: Medicare Other | Admitting: Internal Medicine

## 2020-04-30 VITALS — BP 154/65 | HR 88 | Temp 97.7°F | Resp 18 | Ht 69.0 in | Wt 361.0 lb

## 2020-04-30 DIAGNOSIS — G4733 Obstructive sleep apnea (adult) (pediatric): Secondary | ICD-10-CM

## 2020-04-30 DIAGNOSIS — I10 Essential (primary) hypertension: Secondary | ICD-10-CM | POA: Diagnosis not present

## 2020-04-30 DIAGNOSIS — Z7189 Other specified counseling: Secondary | ICD-10-CM | POA: Diagnosis not present

## 2020-04-30 DIAGNOSIS — Z9989 Dependence on other enabling machines and devices: Secondary | ICD-10-CM

## 2020-04-30 DIAGNOSIS — G4761 Periodic limb movement disorder: Secondary | ICD-10-CM | POA: Diagnosis not present

## 2020-04-30 NOTE — Progress Notes (Signed)
[facility] [place], [place] [address]  Pulmonary Sleep Medicine   Office Visit Note  Patient Name: Carlos Carroll DOB: Apr 06, 1951 MRN [id number]    Chief Complaint: Obstructive Sleep Apnea visit  Brief History:  Carlos Carroll is seen today for follow up we have his iron study results and they appear to be normal. Despite excellent compliance and adequate sleep and control of his apnea, the Epworth Sleepiness Score is 17 out of 24.   Marland Kitchen ROS  General: (-) fever, (-) chills, (-) night sweat Nose and Sinuses: (-) nasal stuffiness or itchiness, (-) postnasal drip, (-) nosebleeds, (-) sinus trouble. Mouth and Throat: (-) sore throat, (-) hoarseness. Neck: (-) swollen glands, (-) enlarged thyroid, (-) neck pain. Respiratory: - cough, - shortness of breath, - wheezing. Neurologic: - numbness, - tingling. Psychiatric: - anxiety, - depression   Current Medication: Outpatient Encounter Medications as of 04/30/2020  Medication Sig  . aspirin EC 81 MG tablet Take 81 mg by mouth daily.  Marland Kitchen atorvastatin (LIPITOR) 40 MG tablet Take 40 mg by mouth daily.  . carvedilol (COREG) 6.25 MG tablet Take 6.25 mg by mouth 2 (two) times daily.  . diclofenac sodium (VOLTAREN) 1 % GEL Apply 1 application topically 4 (four) times daily as needed (arthritis pain.).  Marland Kitchen Flaxseed, Linseed, (FLAX SEED OIL PO) Take 1,400 mg by mouth daily.   . Glucosamine-Chondroit-Vit C-Mn (GLUCOSAMINE 1500 COMPLEX PO) Take 1 tablet by mouth daily.   Marland Kitchen losartan (COZAAR) 100 MG tablet Take 100 mg by mouth daily.  Marland Kitchen losartan (COZAAR) 50 MG tablet Take 50 mg by mouth daily.   . Multiple Vitamin (MULTIVITAMIN WITH MINERALS) TABS tablet Take 1 tablet by mouth daily.  . mupirocin ointment (BACTROBAN) 2 % Apply 1 application topically daily. With dressing changes (Patient not taking: Reported on 03/07/2020)  . mupirocin ointment (BACTROBAN) 2 % Apply 1 application topically daily. Qd to excision site (Patient not  taking: Reported on 03/07/2020)  . [DISCONTINUED] hydrochlorothiazide (HYDRODIURIL) 25 MG tablet Take by mouth.  . [DISCONTINUED] tamsulosin (FLOMAX) 0.4 MG CAPS capsule Take 0.4 mg by mouth daily.   No facility-administered encounter medications on file as of 04/30/2020.    Surgical History: Past Surgical History:  Procedure Laterality Date  . COLONOSCOPY  2005  . COLONOSCOPY WITH PROPOFOL N/A 02/27/2015   Procedure: COLONOSCOPY WITH PROPOFOL;  Surgeon: Lucilla Lame, MD;  Location: [facility] END[facility];  Service: Endoscopy;  Laterality: N/A;  . CYSTOSCOPY W/ RETROGRADES Left 01/24/2019   Procedure: CYSTOSCOPY WITH RETROGRADE PYELOGRAM;  Surgeon: Hollice Espy, MD;  Location: [facility] ORS;  Service: Urology;  Laterality: Left;  . CYSTOSCOPY W/ RETROGRADES Left 02/07/2019   Procedure: CYSTOSCOPY WITH RETROGRADE PYELOGRAM;  Surgeon: Hollice Espy, MD;  Location: [facility] ORS;  Service: Urology;  Laterality: Left;  . CYSTOSCOPY WITH BIOPSY Left 02/07/2019   Procedure: CYSTOSCOPY WITH BIOPSY;  Surgeon: Hollice Espy, MD;  Location: [facility] ORS;  Service: Urology;  Laterality: Left;  . CYSTOSCOPY WITH STENT PLACEMENT Left 01/24/2019   Procedure: CYSTOSCOPY WITH STENT PLACEMENT;  Surgeon: Hollice Espy, MD;  Location: [facility] OR[facility];  Service: Urology;  Laterality: Left;  . CYSTOSCOPY/URETEROSCOPY/HOLMIUM LASER/STENT PLACEMENT Left 02/07/2019   Procedure: CYSTOSCOPY/URETEROSCOPY/HOLMIUM LASER/STENT PLACEMENT;  Surgeon: Hollice Espy, MD;  Location: [facility] ORS;  Service: Urology;  Laterality: Left;    Medical History: Past Medical History:  Diagnosis Date  . Arthritis    KNEE JOINT  . Dysplastic nevi 09/03/2017   Left mid back paraspinal. Moderate atypia, deep margin involved  .  Dysplastic nevi 12/22/2017   Left posterior waistline. Moderate to severe atypia, close to margin. Excised: 12/22/2017, margins free.  . Family history of adverse reaction to anesthesia    MOTHER LOST TASTE AFTER SURGERY  . History of  kidney stones   . Hx of dysplastic nevus 06/18/2016   R ant side at costal margin, mild to moderate atypia  . Hypertension   . Melanoma ([place])    Right upper arm  . Seasonal allergies   . Sleep apnea    CPAP  . Squamous cell carcinoma in situ    left post shoulder, left forearm, left dorsum med forearm  . Squamous cell carcinoma of skin 09/02/2016   Left posterior shoulder. SCCis  . Squamous cell carcinoma of skin 12/08/2016   Left forearm. SCCis  . Squamous cell carcinoma of skin 07/05/2018   Left dorsum medial forearm. SCCis  . Squamous cell carcinoma of skin 01/18/2020   R mid dorsum forearm, excised 01/31/2020    Family History: Non contributory to the present illness  Social History: Social History   Socioeconomic History  . Marital status: Married    Spouse name: Not on file  . Number of children: Not on file  . Years of education: Not on file  . Highest education level: Not on file  Occupational History  . Not on file  Tobacco Use  . Smoking status: Never Smoker  . Smokeless tobacco: Never Used  Vaping Use  . Vaping Use: Never used  Substance and Sexual Activity  . Alcohol use: No  . Drug use: Never  . Sexual activity: Not on file  Other Topics Concern  . Not on file  Social History Narrative  . Not on file   Social Determinants of Health   Financial Resource Strain:   . Difficulty of Paying Living Expenses: Not on file  Food Insecurity:   . Worried About Charity fundraiser in the Last Year: Not on file  . Ran Out of Food in the Last Year: Not on file  Transportation Needs:   . Lack of Transportation (Medical): Not on file  . Lack of Transportation (Non-Medical): Not on file  Physical Activity:   . Days of Exercise per Week: Not on file  . Minutes of Exercise per Session: Not on file  Stress:   . Feeling of Stress : Not on file  Social Connections:   . Frequency of Communication with Friends and Family: Not on file  . Frequency of Social  Gatherings with Friends and Family: Not on file  . Attends Religious Services: Not on file  . Active Member of Clubs or Organizations: Not on file  . Attends Archivist Meetings: Not on file  . Marital Status: Not on file  Intimate Partner Violence:   . Fear of Current or Ex-Partner: Not on file  . Emotionally Abused: Not on file  . Physically Abused: Not on file  . Sexually Abused: Not on file    Vital Signs: Blood pressure (!) 154/65, pulse 88, temperature 97.7 F (36.5 C), temperature source Temporal, resp. rate 18, height 5\' 9"  (1.753 m), weight (!) 361 lb (163.7 kg), SpO2 94 %.  Examination: General Appearance: The patient is well-developed, well-nourished, and in no distress. Neck Circumference: - Skin: Gross inspection of skin unremarkable. Head: normocephalic, no gross deformities. Eyes: no gross deformities noted. ENT: ears appear grossly normal Neurologic: Alert and oriented. No involuntary movements.    EPWORTH SLEEPINESS SCALE:  Scale:  (0)=  no chance of dozing; (1)= slight chance of dozing; (2)= moderate chance of dozing; (3)= high chance of dozing  Chance  Situtation    Sitting and reading: 3    Watching TV: 3    Sitting Inactive in public: 2    As a passenger in car: 3      Lying down to rest: 2    Sitting and talking: 1    Sitting quielty after lunch: 3    In a car, stopped in traffic: 0   TOTAL SCORE:   17 out of 24    SLEEP STUDIES:  PSG 6.25.21  -  AHI 52.2,   lowest saturation  78%   CPAP COMPLIANCE DATA:  Date Range: 03/27/20 - 04/25/20  Average Daily Use: 6:55 hours  Median Use: 6:58  Compliance for > 4 Hours: 100%  AHI: 0.9 respiratory events per hour  Days Used: 30/30  Mask Leak: 20.8  95th Percentile Pressure: 11.0 cmH2O         LABS: Recent Results (from the past 2160 hour(s))  Urinalysis, Complete     Status: Abnormal   Collection Time: 03/07/20  1:49 PM  Result Value Ref Range   Specific  Gravity, UA >1.030 (H) 1.005 - 1.030   pH, UA 5.5 5.0 - 7.5   Color, UA Yellow Yellow   Appearance Ur Clear Clear   Leukocytes,UA Negative Negative   Protein,UA Negative Negative/Trace   Glucose, UA Negative Negative   Ketones, UA Trace (A) Negative   RBC, UA Trace (A) Negative   Bilirubin, UA Negative Negative   Urobilinogen, Ur 0.2 0.2 - 1.0 mg/dL   Nitrite, UA Negative Negative   Microscopic Examination See below:   Microscopic Examination     Status: Abnormal   Collection Time: 03/07/20  1:49 PM   Urine  Result Value Ref Range   WBC, UA 0-5 0 - 5 /hpf   RBC 0-2 0 - 2 /hpf   Epithelial Cells (non renal) None seen 0 - 10 /hpf   Casts Present (A) None seen /lpf   Cast Type Hyaline casts N/A   Bacteria, UA Few None seen/Few  Fe+TIBC+Fer     Status: Abnormal   Collection Time: 04/09/20  1:55 PM  Result Value Ref Range   Total Iron Binding Capacity 316 250 - 450 ug/dL   UIBC 278 111 - 343 ug/dL   Iron 38 38 - 169 ug/dL   Iron Saturation 12 (L) 15 - 55 %   Ferritin 113 30.0 - 400.0 ng/mL    Radiology: Abdomen 1 view (KUB)  Result Date: 03/08/2020 CLINICAL DATA:  Follow-up on kidney stones.  No current complaints. EXAM: ABDOMEN - 1 VIEW COMPARISON:  CT 01/18/2019 FINDINGS: Tiny 2 mm stone demonstrated over the left renal lower pole, correlating with stone seen on the prior CT. No ureteral stones are identified. Calcified granulomas in the spleen. Scattered gas and stool in the colon. No small or large bowel distention. Degenerative changes in the spine and hips. IMPRESSION: Tiny stone demonstrated over the left renal lower pole. No ureteral stones identified. Electronically Signed   By: Lucienne Capers M.D.   On: 03/08/2020 23:32    No results found.  No results found.    Assessment and Plan: Patient Active Problem List   Diagnosis Date Noted  . CPAP use counseling 04/09/2020  . PLMD (periodic limb movement disorder) 04/09/2020  . Hyperlipidemia 01/19/2019  . Hx of  melanoma of skin 01/19/2019  . Hypertension  01/19/2019  . OSA on CPAP 01/19/2019  . Primary osteoarthritis of right knee 05/10/2018  . Abnormal laboratory test 03/01/2018  . BMI 50.0-59.9, adult ([place]) 03/26/2016  . Special screening for malignant neoplasms, colon   . Diverticulosis of large intestine without diverticulitis   . Allergic rhinitis 10/12/2012      The patient does tolerate PAP and reports some benefit from PAP use. The The compliance is excellent. The apnea is well controlled.  His iron studies show low saturation and borderline for iron. Ferritin is ok. A trial of supplemental iron may be of benefit..   1.  OSA - compliance well 2.  CPAP couseling-Discussed importance of adequate CPAP use as well as proper care and cleaning techniques of machine and all supplies. 3.  HTN - pt states daily home BP measurements ave 120/80s 4.  PLMD - Pt reports not interrupting his sleep. Ferritin and iron studies returned. All WNL except Iron 38.  May benefit with daily iron supplementation to help reduce PLMD episodes.    General Counseling: I have discussed the findings of the evaluation and examination with Carlos Carroll.  I have also discussed any further diagnostic evaluation thatmay be needed or ordered today. Carlos Carroll verbalizes understanding of the findings of todays visit. We also reviewed his medications today and discussed drug interactions and side effects including but not limited excessive drowsiness and altered mental states. We also discussed that there is always a risk not just to him but also people around him. he has been encouraged to call the office with any questions or concerns that should arise related to todays visit.  No orders of the defined types were placed in this encounter.     This patient was seen by Shannan Harper, AGNP-C in collaboration with Dr. Devona Konig as a part of collaborative care agreement.   I have personally obtained a history, examined the  patient, evaluated laboratory and imaging results, formulated the assessment and plan and placed orders.   Richelle Ito Saunders Glance, PhD, FAASM  Diplomate, American Board of Sleep Medicine    Allyne Gee, MD [facility] Diplomate ABMS Pulmonary and Critical Care Medicine Sleep medicine

## 2020-04-30 NOTE — Progress Notes (Deleted)
error 

## 2020-04-30 NOTE — Patient Instructions (Signed)

## 2020-05-11 ENCOUNTER — Encounter: Payer: Self-pay | Admitting: Podiatry

## 2020-05-11 ENCOUNTER — Ambulatory Visit: Payer: Medicare Other | Admitting: Podiatry

## 2020-05-11 ENCOUNTER — Ambulatory Visit (INDEPENDENT_AMBULATORY_CARE_PROVIDER_SITE_OTHER): Payer: Medicare Other

## 2020-05-11 ENCOUNTER — Other Ambulatory Visit: Payer: Self-pay

## 2020-05-11 DIAGNOSIS — L97521 Non-pressure chronic ulcer of other part of left foot limited to breakdown of skin: Secondary | ICD-10-CM | POA: Diagnosis not present

## 2020-05-11 DIAGNOSIS — M2042 Other hammer toe(s) (acquired), left foot: Secondary | ICD-10-CM | POA: Diagnosis not present

## 2020-05-11 DIAGNOSIS — M722 Plantar fascial fibromatosis: Secondary | ICD-10-CM

## 2020-05-11 DIAGNOSIS — M19071 Primary osteoarthritis, right ankle and foot: Secondary | ICD-10-CM | POA: Diagnosis not present

## 2020-05-11 MED ORDER — MELOXICAM 15 MG PO TABS: 15.0000 mg | ORAL_TABLET | Freq: Every day | ORAL | 1 refills | Status: DC

## 2020-05-11 MED ORDER — BETAMETHASONE SOD PHOS & ACET 6 (3-3) MG/ML IJ SUSP
12.0000 mg | Freq: Once | INTRAMUSCULAR | Status: AC
  Administered 2020-05-11: 12 mg via INTRAMUSCULAR

## 2020-05-11 NOTE — Progress Notes (Signed)
   Subjective: 69 y.o. male    Past Medical History:  Diagnosis Date  . Arthritis    KNEE JOINT  . Dysplastic nevi 09/03/2017   Left mid back paraspinal. Moderate atypia, deep margin involved  . Dysplastic nevi 12/22/2017   Left posterior waistline. Moderate to severe atypia, close to margin. Excised: 12/22/2017, margins free.  . Family history of adverse reaction to anesthesia    MOTHER LOST TASTE AFTER SURGERY  . History of kidney stones   . Hx of dysplastic nevus 06/18/2016   R ant side at costal margin, mild to moderate atypia  . Hypertension   . Melanoma ([place])    Right upper arm  . Seasonal allergies   . Sleep apnea    CPAP  . Squamous cell carcinoma in situ    left post shoulder, left forearm, left dorsum med forearm  . Squamous cell carcinoma of skin 09/02/2016   Left posterior shoulder. SCCis  . Squamous cell carcinoma of skin 12/08/2016   Left forearm. SCCis  . Squamous cell carcinoma of skin 07/05/2018   Left dorsum medial forearm. SCCis  . Squamous cell carcinoma of skin 01/18/2020   R mid dorsum forearm, excised 01/31/2020     Objective: Physical Exam General: The patient is alert and oriented x3 in no acute distress.  Dermatology: Skin is warm, dry and supple bilateral lower extremities. Negative for open lesions or macerations bilateral.   Vascular: Dorsalis Pedis and Posterior Tibial pulses palpable bilateral.  Capillary fill time is immediate to all digits.  Neurological: Epicritic and protective threshold intact bilateral.   Musculoskeletal: Tenderness to palpation to the plantar aspect of the right heel along the plantar fascia. All other joints range of motion within normal limits bilateral. Strength 5/5 in all groups bilateral.   Radiographic exam: Normal osseous mineralization. Joint spaces preserved. No fracture/dislocation/boney destruction. No other soft tissue abnormalities or radiopaque foreign bodies.   Assessment: 1. Plantar fasciitis  right 2.  Chronic DJD right ankle 3.  Ulcer distal tip of the left third toe  Plan of Care:  1. Patient evaluated. Xrays reviewed.   2. Injection of 0.5cc Celestone soluspan injected into the right plantar fascia  3.Rx for Meloxicam ordered for patient. 4.  Continue wearing good supportive ankle brace for the right ankle for chronic ankle arthritis and degenerative changes 5.  Excisional debridement of the open wound to the left third toe was performed using a tissue nipper.  Excisional debridement of all the necrotic nonviable tissue down to healthy bleeding viable tissue was performed with post debridement measurement same as pre-.  The toe wound measures approximately 0.3 x 0.3 x 0.1 cm.  No infection noted.  Silicone toe caps were dispensed today to offload pressure from the distal tip of the toe 6. Instructed patient regarding therapies and modalities at home to alleviate symptoms.  7. Return to clinic in 4 weeks.     Edrick Kins, DPM [facility]  Dr. Edrick Kins, DPM    [address],  [address]                Office [phone]  Fax [phone]

## 2020-07-08 ENCOUNTER — Other Ambulatory Visit: Payer: Self-pay | Admitting: Podiatry

## 2020-07-10 NOTE — Telephone Encounter (Signed)
Please advise 

## 2020-07-19 ENCOUNTER — Encounter: Payer: Self-pay | Admitting: Dermatology

## 2020-07-19 ENCOUNTER — Ambulatory Visit: Payer: Medicare Other | Admitting: Dermatology

## 2020-07-19 ENCOUNTER — Other Ambulatory Visit: Payer: Self-pay

## 2020-07-19 DIAGNOSIS — L570 Actinic keratosis: Secondary | ICD-10-CM

## 2020-07-19 DIAGNOSIS — Z86018 Personal history of other benign neoplasm: Secondary | ICD-10-CM

## 2020-07-19 DIAGNOSIS — L918 Other hypertrophic disorders of the skin: Secondary | ICD-10-CM

## 2020-07-19 DIAGNOSIS — L821 Other seborrheic keratosis: Secondary | ICD-10-CM

## 2020-07-19 DIAGNOSIS — L578 Other skin changes due to chronic exposure to nonionizing radiation: Secondary | ICD-10-CM

## 2020-07-19 DIAGNOSIS — D229 Melanocytic nevi, unspecified: Secondary | ICD-10-CM

## 2020-07-19 DIAGNOSIS — L820 Inflamed seborrheic keratosis: Secondary | ICD-10-CM

## 2020-07-19 DIAGNOSIS — Z1283 Encounter for screening for malignant neoplasm of skin: Secondary | ICD-10-CM

## 2020-07-19 DIAGNOSIS — Z85820 Personal history of malignant melanoma of skin: Secondary | ICD-10-CM

## 2020-07-19 DIAGNOSIS — Z85828 Personal history of other malignant neoplasm of skin: Secondary | ICD-10-CM | POA: Diagnosis not present

## 2020-07-19 DIAGNOSIS — L814 Other melanin hyperpigmentation: Secondary | ICD-10-CM

## 2020-07-19 DIAGNOSIS — D1800 Hemangioma unspecified site: Secondary | ICD-10-CM

## 2020-07-19 NOTE — Progress Notes (Signed)
Follow-Up Visit   Subjective  Carlos Carroll is a 70 y.o. male who presents for the following: Annual Exam (UBSE - hx MM, SCC, BCC, dysplastic nevi ).  The patient presents for Upper Body Skin Exam (UBSE) for skin cancer screening and mole check.  The following portions of the chart were reviewed this encounter and updated as appropriate:   Tobacco  Allergies  Meds  Problems  Med Hx  Surg Hx  Fam Hx     Review of Systems:  No other skin or systemic complaints except as noted in HPI or Assessment and Plan.  Objective  Well appearing patient in no apparent distress; mood and affect are within normal limits.  All skin waist up examined.  Objective  R clavicle, L sideburn, scalp (3): Erythematous keratotic or waxy stuck-on papule or plaque.   Objective  face and ears x 4 (4): Erythematous thin papules/macules with gritty scale.   Assessment & Plan  Inflamed seborrheic keratosis (3) R clavicle, L sideburn, scalp  Destruction of lesion - R clavicle, L sideburn, scalp Complexity: simple   Destruction method: cryotherapy   Informed consent: discussed and consent obtained   Timeout:  patient name, date of birth, surgical site, and procedure verified Lesion destroyed using liquid nitrogen: Yes   Region frozen until ice ball extended beyond lesion: Yes   Outcome: patient tolerated procedure well with no complications   Post-procedure details: wound care instructions given    AK (actinic keratosis) (4) face and ears x 4  Destruction of lesion - face and ears x 4 Complexity: simple   Destruction method: cryotherapy   Informed consent: discussed and consent obtained   Timeout:  patient name, date of birth, surgical site, and procedure verified Lesion destroyed using liquid nitrogen: Yes   Region frozen until ice ball extended beyond lesion: Yes   Outcome: patient tolerated procedure well with no complications   Post-procedure details: wound care instructions given     Skin cancer screening   Lentigines - Scattered tan macules - Discussed due to sun exposure - Benign, observe - Call for any changes  Seborrheic Keratoses - Stuck-on, waxy, tan-brown papules and plaques  - Discussed benign etiology and prognosis. - Observe - Call for any changes  Melanocytic Nevi - Tan-brown and/or pink-flesh-colored symmetric macules and papules - Benign appearing on exam today - Observation - Call clinic for new or changing moles - Recommend daily use of broad spectrum spf 30+ sunscreen to sun-exposed areas.   Hemangiomas - Red papules - Discussed benign nature - Observe - Call for any changes  Actinic Damage - Chronic, secondary to cumulative UV/sun exposure - diffuse scaly erythematous macules with underlying dyspigmentation - Recommend daily broad spectrum sunscreen SPF 30+ to sun-exposed areas, reapply every 2 hours as needed.  - Call for new or changing lesions.  History of Basal Cell Carcinoma of the Skin - No evidence of recurrence today - Recommend regular full body skin exams - Recommend daily broad spectrum sunscreen SPF 30+ to sun-exposed areas, reapply every 2 hours as needed.  - Call if any new or changing lesions are noted between office visits  History of Dysplastic Nevi - No evidence of recurrence today - Recommend regular full body skin exams - Recommend daily broad spectrum sunscreen SPF 30+ to sun-exposed areas, reapply every 2 hours as needed.  - Call if any new or changing lesions are noted between office visits  History of Squamous Cell Carcinoma of the Skin - No  evidence of recurrence today - No lymphadenopathy - Recommend regular full body skin exams - Recommend daily broad spectrum sunscreen SPF 30+ to sun-exposed areas, reapply every 2 hours as needed.  - Call if any new or changing lesions are noted between office visits  History of Melanoma - No evidence of recurrence today - No lymphadenopathy - Recommend  regular full body skin exams - Recommend daily broad spectrum sunscreen SPF 30+ to sun-exposed areas, reapply every 2 hours as needed.  - Call if any new or changing lesions are noted between office visits  Acrochordons (Skin Tags) - Fleshy, skin-colored pedunculated papules - Benign appearing.  - Observe. - If desired, they can be removed with an in office procedure that is not covered by insurance. - Please call the clinic if you notice any new or changing lesions.  Skin cancer screening performed today.  Return in about 6 months (around 01/16/2021) for TBSE - Hx MM, dysplastic nevi, BCC, SCC .  I, Rudell Cobb, CMA, am acting as scribe for Sarina Ser, MD .  Documentation: I have reviewed the above documentation for accuracy and completeness, and I agree with the above.  Sarina Ser, MD

## 2020-07-22 ENCOUNTER — Encounter: Payer: Self-pay | Admitting: Dermatology

## 2020-09-18 ENCOUNTER — Other Ambulatory Visit: Payer: Self-pay | Admitting: Podiatry

## 2021-02-20 ENCOUNTER — Ambulatory Visit: Payer: Medicare Other | Admitting: Dermatology

## 2021-02-20 ENCOUNTER — Other Ambulatory Visit: Payer: Self-pay

## 2021-02-20 DIAGNOSIS — L570 Actinic keratosis: Secondary | ICD-10-CM | POA: Diagnosis not present

## 2021-02-20 DIAGNOSIS — L820 Inflamed seborrheic keratosis: Secondary | ICD-10-CM | POA: Diagnosis not present

## 2021-02-20 DIAGNOSIS — Z85828 Personal history of other malignant neoplasm of skin: Secondary | ICD-10-CM

## 2021-02-20 DIAGNOSIS — Z86018 Personal history of other benign neoplasm: Secondary | ICD-10-CM | POA: Diagnosis not present

## 2021-02-20 DIAGNOSIS — L578 Other skin changes due to chronic exposure to nonionizing radiation: Secondary | ICD-10-CM

## 2021-02-20 DIAGNOSIS — Z1283 Encounter for screening for malignant neoplasm of skin: Secondary | ICD-10-CM

## 2021-02-20 DIAGNOSIS — D1800 Hemangioma unspecified site: Secondary | ICD-10-CM

## 2021-02-20 DIAGNOSIS — D229 Melanocytic nevi, unspecified: Secondary | ICD-10-CM

## 2021-02-20 DIAGNOSIS — Z872 Personal history of diseases of the skin and subcutaneous tissue: Secondary | ICD-10-CM

## 2021-02-20 DIAGNOSIS — Z85820 Personal history of malignant melanoma of skin: Secondary | ICD-10-CM | POA: Diagnosis not present

## 2021-02-20 DIAGNOSIS — L821 Other seborrheic keratosis: Secondary | ICD-10-CM

## 2021-02-20 DIAGNOSIS — L814 Other melanin hyperpigmentation: Secondary | ICD-10-CM

## 2021-02-20 DIAGNOSIS — I8310 Varicose veins of unspecified lower extremity with inflammation: Secondary | ICD-10-CM

## 2021-02-20 NOTE — Progress Notes (Signed)
Follow-Up Visit   Subjective  Carlos Carroll is a 70 y.o. male who presents for the following: Annual Exam (Hx MM, SCC, dysplastic nevi, AK's, ISK's ). The patient presents for Total-Body Skin Exam (TBSE) for skin cancer screening and mole check.  The following portions of the chart were reviewed this encounter and updated as appropriate:   Tobacco  Allergies  Meds  Problems  Med Hx  Surg Hx  Fam Hx     Review of Systems:  No other skin or systemic complaints except as noted in HPI or Assessment and Plan.  Objective  Well appearing patient in no apparent distress; mood and affect are within normal limits.  A full examination was performed including scalp, head, eyes, ears, nose, lips, neck, chest, axillae, abdomen, back, buttocks, bilateral upper extremities, bilateral lower extremities, hands, feet, fingers, toes, fingernails, and toenails. All findings within normal limits unless otherwise noted below.  Forehead x 9 (9) Erythematous thin papules/macules with gritty scale.   L forearm x 1 Erythematous keratotic or waxy stuck-on papule or plaque.    Assessment & Plan  AK (actinic keratosis) (9) Forehead x 9  Destruction of lesion - Forehead x 9 Complexity: simple   Destruction method: cryotherapy   Informed consent: discussed and consent obtained   Timeout:  patient name, date of birth, surgical site, and procedure verified Lesion destroyed using liquid nitrogen: Yes   Region frozen until ice ball extended beyond lesion: Yes   Outcome: patient tolerated procedure well with no complications   Post-procedure details: wound care instructions given    Inflamed seborrheic keratosis L forearm x 1  Destruction of lesion - L forearm x 1 Complexity: simple   Destruction method: cryotherapy   Informed consent: discussed and consent obtained   Timeout:  patient name, date of birth, surgical site, and procedure verified Lesion destroyed using liquid nitrogen: Yes    Region frozen until ice ball extended beyond lesion: Yes   Outcome: patient tolerated procedure well with no complications   Post-procedure details: wound care instructions given    Skin cancer screening  Lentigines - Scattered tan macules - Due to sun exposure - Benign-appearing, observe - Recommend daily broad spectrum sunscreen SPF 30+ to sun-exposed areas, reapply every 2 hours as needed. - Call for any changes  Seborrheic Keratoses - Stuck-on, waxy, tan-brown papules and/or plaques  - Benign-appearing - Discussed benign etiology and prognosis. - Observe - Call for any changes  Melanocytic Nevi - Tan-brown and/or pink-flesh-colored symmetric macules and papules - Benign appearing on exam today - Observation - Call clinic for new or changing moles - Recommend daily use of broad spectrum spf 30+ sunscreen to sun-exposed areas.   Hemangiomas - Red papules - Discussed benign nature - Observe - Call for any changes  Actinic Damage - Severe, confluent actinic changes with pre-cancerous actinic keratoses  - Severe, chronic, not at goal, secondary to cumulative UV radiation exposure over time - diffuse scaly erythematous macules and papules with underlying dyspigmentation - Discussed Prescription "Field Treatment" for Severe, Chronic Confluent Actinic Changes with Pre-Cancerous Actinic Keratoses Field treatment involves treatment of an entire area of skin that has confluent Actinic Changes (Sun/ Ultraviolet light damage) and PreCancerous Actinic Keratoses by method of PhotoDynamic Therapy (PDT) and/or prescription Topical Chemotherapy agents such as 5-fluorouracil, 5-fluorouracil/calcipotriene, and/or imiquimod.  The purpose is to decrease the number of clinically evident and subclinical PreCancerous lesions to prevent progression to development of skin cancer by chemically destroying early precancer changes  that may or may not be visible.  It has been shown to reduce the risk of  developing skin cancer in the treated area. As a result of treatment, redness, scaling, crusting, and open sores may occur during treatment course. One or more than one of these methods may be used and may have to be used several times to control, suppress and eliminate the PreCancerous changes. Discussed treatment course, expected reaction, and possible side effects. - Recommend daily broad spectrum sunscreen SPF 30+ to sun-exposed areas, reapply every 2 hours as needed.  - Staying in the shade or wearing long sleeves, sun glasses (UVA+UVB protection) and wide brim hats (4-inch brim around the entire circumference of the hat) are also recommended. - Call for new or changing lesions.   History of Melanoma - No evidence of recurrence today - No lymphadenopathy - Recommend regular full body skin exams - Recommend daily broad spectrum sunscreen SPF 30+ to sun-exposed areas, reapply every 2 hours as needed.  - Call if any new or changing lesions are noted between office visits  History of Dysplastic Nevi - No evidence of recurrence today - Recommend regular full body skin exams - Recommend daily broad spectrum sunscreen SPF 30+ to sun-exposed areas, reapply every 2 hours as needed.  - Call if any new or changing lesions are noted between office visits  History of Squamous Cell Carcinoma of the Skin - No evidence of recurrence today - No lymphadenopathy - Recommend regular full body skin exams - Recommend daily broad spectrum sunscreen SPF 30+ to sun-exposed areas, reapply every 2 hours as needed.  - Call if any new or changing lesions are noted between office visits  Varicose Veins/Spider Veins - Dilated blue, purple or red veins at the lower extremities - Reassured - Smaller vessels can be treated by sclerotherapy (a procedure to inject a medicine into the veins to make them disappear) if desired, but the treatment is not covered by insurance. Larger vessels may be covered if symptomatic  and we would refer to vascular surgeon if treatment desired.  Skin cancer screening performed today.  Return in about 6 months (around 08/20/2021) for UBSE - hx MM, SCC, dysplastic nevi, AK's, ISK's; PDT on the face in 4 weeks.  Luther Redo, CMA, am acting as scribe for Sarina Ser, MD . Documentation: I have reviewed the above documentation for accuracy and completeness, and I agree with the above.  Sarina Ser, MD

## 2021-02-20 NOTE — Patient Instructions (Signed)

## 2021-02-23 ENCOUNTER — Encounter: Payer: Self-pay | Admitting: Dermatology

## 2021-03-04 ENCOUNTER — Ambulatory Visit
Admission: RE | Admit: 2021-03-04 | Discharge: 2021-03-04 | Disposition: A | Discharge status: Home / Self Care | Payer: Medicare Other | Source: Ambulatory Visit | Attending: Urology | Admitting: Urology

## 2021-03-04 DIAGNOSIS — N201 Calculus of ureter: Secondary | ICD-10-CM

## 2021-03-06 NOTE — Progress Notes (Signed)
03/07/21 2:16 PM   Carlos Carroll 13-Oct-1950 [id number]  Referring provider:  Gayland Curry, MD [address] [place],  [place] [address]  Chief Complaint  Patient presents with   Nephrolithiasis    Urological history: History of nephrolithiasis - underwent a staged left ureteroscopy in 01/2019 with Dr. Erlene Quan for a 13 mm left proximal ureteral calculus -Stone composition 70% calcium oxalate dihydrate, 30% calcium oxalate monohydrate.  HPI: Carlos Carroll is a 70 y.o.male who presents today for a 1 year follow-up with [facility].   03/04/2021 revealed tiny 2 mm stone over the left lower pole can not be excluded with no evidence or ureteral stone. Calcified densities again noted over the spleen consistent calcified splenic granulomas.   He is doing well today. He denies any modifying or aggravating factors.  Patient denies any gross hematuria, dysuria or suprapubic/flank pain.  Patient denies any fevers, chills, nausea or vomiting.     PMH: Past Medical History:  Diagnosis Date   Arthritis    KNEE JOINT   Dysplastic nevi 09/03/2017   Left mid back paraspinal. Moderate atypia, deep margin involved   Dysplastic nevi 12/22/2017   Left posterior waistline. Moderate to severe atypia, close to margin. Excised: 12/22/2017, margins free.   Family history of adverse reaction to anesthesia    MOTHER LOST TASTE AFTER SURGERY   History of kidney stones    Hx of dysplastic nevus 06/18/2016   R ant side at costal margin, mild to moderate atypia   Hypertension    Melanoma ([facility]) 2002   Right upper arm   Seasonal allergies    Sleep apnea    CPAP   Squamous cell carcinoma in situ    left post shoulder, left forearm, left dorsum med forearm   Squamous cell carcinoma of skin 09/02/2016   Left posterior shoulder. SCCis   Squamous cell carcinoma of skin 12/08/2016   Left forearm. SCCis   Squamous cell carcinoma of skin 07/05/2018   Left dorsum medial forearm. SCCis   Squamous cell  carcinoma of skin 01/18/2020   R mid dorsum forearm, excised 01/31/2020    Surgical History: Past Surgical History:  Procedure Laterality Date   COLONOSCOPY  2005   COLONOSCOPY WITH PROPOFOL N/A 02/27/2015   Procedure: COLONOSCOPY WITH PROPOFOL;  Surgeon: Lucilla Lame, MD;  Location: [facility];  Service: Endoscopy;  Laterality: N/A;   CYSTOSCOPY W/ RETROGRADES Left 01/24/2019   Procedure: CYSTOSCOPY WITH RETROGRADE PYELOGRAM;  Surgeon: Hollice Espy, MD;  Location: [facility];  Service: Urology;  Laterality: Left;   CYSTOSCOPY W/ RETROGRADES Left 02/07/2019   Procedure: CYSTOSCOPY WITH RETROGRADE PYELOGRAM;  Surgeon: Hollice Espy, MD;  Location: [facility];  Service: Urology;  Laterality: Left;   CYSTOSCOPY WITH BIOPSY Left 02/07/2019   Procedure: CYSTOSCOPY WITH BIOPSY;  Surgeon: Hollice Espy, MD;  Location: [facility];  Service: Urology;  Laterality: Left;   CYSTOSCOPY WITH STENT PLACEMENT Left 01/24/2019   Procedure: CYSTOSCOPY WITH STENT PLACEMENT;  Surgeon: Hollice Espy, MD;  Location: [facility];  Service: Urology;  Laterality: Left;   CYSTOSCOPY/URETEROSCOPY/HOLMIUM LASER/STENT PLACEMENT Left 02/07/2019   Procedure: CYSTOSCOPY/URETEROSCOPY/HOLMIUM LASER/STENT PLACEMENT;  Surgeon: Hollice Espy, MD;  Location: [facility];  Service: Urology;  Laterality: Left;    Home Medications:  Allergies as of 03/07/2021   No Known Allergies      Medication List        Accurate as of March 07, 2021  2:16 PM. If you have any questions, ask your nurse or doctor.  aspirin EC 81 MG tablet Take 81 mg by mouth daily.   atorvastatin 40 MG tablet Commonly known as: LIPITOR Take 40 mg by mouth daily.   carvedilol 6.25 MG tablet Commonly known as: COREG Take 6.25 mg by mouth 2 (two) times daily.   diclofenac sodium 1 % Gel Commonly known as: VOLTAREN Apply 1 application topically 4 (four) times daily as needed (arthritis pain.).   FLAX SEED OIL PO Take 1,400 mg by  mouth daily.   GLUCOSAMINE 1500 COMPLEX PO Take 1 tablet by mouth daily.   hydrochlorothiazide 25 MG tablet Commonly known as: HYDRODIURIL Take 25 mg by mouth daily.   losartan 100 MG tablet Commonly known as: COZAAR Take 100 mg by mouth daily. What changed: Another medication with the same name was removed. Continue taking this medication, and follow the directions you see here. Changed by: Zara Council, PA-C   meloxicam 15 MG tablet Commonly known as: MOBIC TAKE 1 TABLET BY MOUTH EVERY DAY   multivitamin with minerals Tabs tablet Take 1 tablet by mouth daily.   tamsulosin 0.4 MG Caps capsule Commonly known as: FLOMAX Take 0.4 mg by mouth daily.        Allergies:  No Known Allergies  Family History: No family history on file.  Social History:  reports that he has never smoked. He has never used smokeless tobacco. He reports that he does not drink alcohol and does not use drugs.   Physical Exam: BP 106/69   Pulse 73   Ht 5\' 9"  (1.753 m)   Wt (!) 360 lb (163.3 kg)   BMI 53.16 kg/m   Constitutional:  Alert and oriented, No acute distress. HEENT: [place] AT, mask in place.  Trachea midline Cardiovascular: No clubbing, cyanosis, or edema. Respiratory: Normal respiratory effort, no increased work of breathing. Skin: No rashes, bruises or suspicious lesions. Neurologic: Grossly intact, no focal deficits, moving all 4 extremities. Psychiatric: Normal mood and affect.  Laboratory Data:  Ref Range & Units 2 mo ago  PSA (Prostate Specific Antigen), Total <=6.49 ng/mL 0.95   Comment: [facility] PSA Screening algorithm, based on a multi-disciplinary consensus panel review of best reported practice in the literature.  All recommendations and treatment decisions should be made in conjunction with the patient after discussion and counseling.   If PSA >= 6.5 ng/ml, consider referral to Urology  If PSA <  6.5 ng/ml, consider screening every two years   Access  [employer] Total-PSA Method:   The measured value of this analyte can vary depending upon the testing procedure used. Values determined on patient samples by differing testing procedures cannot be directly compared with one another, and could be cause of erroneous medical interpretation.  Resulting Agency  [facility]  Specimen Collected: 12/14/20 09:32 Last Resulted: 12/14/20 16:00  Received From: [facility]  Result Received: 02/20/21 14:49   Complete Blood Count (CBC) with Differential Order: [id number]  Ref Range & Units 2 mo ago  WBC (White Blood Cell Count) 3.2 - 9.8 x10^9/L 8.8   Hemoglobin 13.7 - 17.3 g/dL 15.5   Hematocrit 39.0 - 49.0 % 47.1   Platelets 150 - 450 x10^9/L 265   MCV (Mean Corpuscular Volume) 80 - 98 fL 88   MCH (Mean Corpuscular Hemoglobin) 26.5 - 34.0 pg 28.9   MCHC (Mean Corpuscular Hemoglobin Concentration) 31.5 - 36.3 % 32.9   RBC (Red Blood Cell Count) 4.37 - 5.74 x10^12/L 5.36   RDW-CV (Red Cell Distribution Width)  11.5 - 14.5 % 16.4 High    MPV (Mean Platelet Volume) 7.2 - 11.7 fL 8.8   Neutrophil Count 2.0 - 8.6 x10^9/L 5.8   Neutrophil % 37 - 80 % 65.6   Lymphocyte Count 0.6 - 4.2 x10^9/L 2.0   Lymphocyte % 10 - 50 % 23.0   Monocyte Count 0 - 0.9 x10^9/L 0.8   Monocyte % 0 - 12 % 8.9   Eosinophil Count 0 - 0.70 x10^9/L 0.19   Eosinophil % 0 - 7 % 2.2   Basophil Count 0 - 0.20 x10^9/L 0.03   Basophil % 0 - 2 % 0.3   Resulting Agency  [facility]  Specimen Collected: 12/14/20 09:32 Last Resulted: 12/14/20 09:57  Received From: [facility]  Result Received: 02/20/21 14:49    Ref Range & Units 2 mo ago  Iron 50 - 160 g/dL 54   Total Iron Binding Capacity (TIBC) 261 - 478 g/dL 322   Percent Transferrin Saturation 15 - 55 % 17   Resulting Agency  [facility]  Specimen Collected: 12/14/20 09:32 Last Resulted: 12/14/20 15:53  Received From: [facility]  Result Received: 02/20/21 14:49  I have reviewed the labs.   Pertinent Imaging: CLINICAL DATA:  Left renal stone.   EXAM: ABDOMEN - 1 VIEW   COMPARISON:  03/07/2020.  CT 01/18/2019.   FINDINGS: Tiny 2 mm stone over the left lower renal pole can not be excluded. No ureteral stone noted. Calcific densities again noted over the spleen consistent with calcified splenic granulomas. Gas pattern is nonspecific. No bowel distention or free air. Degenerative change thoracolumbar spine and both hips.   IMPRESSION: 1. Tiny 2 mm stone over the left lower renal pole can not be excluded. No evidence of ureteral stone.   2. Calcified densities again noted over the spleen consistent calcified splenic granulomas.     Electronically Signed   By: Marcello Moores  Register M.D.   On: 03/05/2021 07:55 I have independently reviewed the films.  See HPI.    Assessment & Plan:    Left renal stone  - Tiny 2 mm stone over the left lower renal pole can not be excluded. No evidence of ureteral stone.  - Stone composition 70% calcium oxalate dihydrate, 30% calcium oxalate monohydrate  - We discussed general stone prevention techniques including drinking plenty water with goal of producing 2.5 L urine daily, increased citric acid intake, avoidance of high oxalate containing foods, and decreased salt intake.  Information about dietary recommendations given today.    Return if symptoms worsen or fail to improve.  [facility] [address], [place], [place] [address] [phone]  [facility] as a scribe for [facility], PA-C.,have documented all relevant documentation on the behalf of Mikylah Ackroyd, PA-C,as directed by  [facility], PA-C while in the presence of Rebakah Cokley, PA-C.  I have reviewed the above documentation for accuracy and completeness, and I agree with the above.    Zara Council, PA-C

## 2021-03-07 ENCOUNTER — Other Ambulatory Visit: Payer: Self-pay

## 2021-03-07 ENCOUNTER — Encounter: Payer: Self-pay | Admitting: Urology

## 2021-03-07 ENCOUNTER — Ambulatory Visit: Payer: Medicare Other | Admitting: Urology

## 2021-03-07 VITALS — BP 106/69 | HR 73 | Ht 69.0 in | Wt 360.0 lb

## 2021-03-07 DIAGNOSIS — N200 Calculus of kidney: Secondary | ICD-10-CM

## 2021-03-26 ENCOUNTER — Other Ambulatory Visit: Payer: Self-pay

## 2021-03-26 ENCOUNTER — Ambulatory Visit: Payer: Medicare Other

## 2021-03-26 DIAGNOSIS — L570 Actinic keratosis: Secondary | ICD-10-CM | POA: Diagnosis not present

## 2021-03-26 MED ORDER — AMINOLEVULINIC ACID HCL 20 % EX SOLR
1.0000 "application " | Freq: Once | CUTANEOUS | Status: AC
  Administered 2021-03-26: 354 mg via TOPICAL

## 2021-03-26 NOTE — Patient Instructions (Signed)

## 2021-03-26 NOTE — Progress Notes (Signed)
1. AK (actinic keratosis) Head - Anterior (Face)  Photodynamic therapy - Head - Anterior (Face) Procedure discussed: discussed risks, benefits, side effects. and alternatives   Prep: site scrubbed/prepped with acetone   Location:  Face Number of lesions:  Multiple Type of treatment:  Blue light Aminolevulinic Acid (see MAR for details): Levulan Number of Levulan sticks used:  1 Incubation time (minutes):  60 Number of minutes under lamp:  16 Number of seconds under lamp:  40 Cooling:  Floor fan Outcome: patient tolerated procedure well with no complications   Post-procedure details: sunscreen applied and aftercare instructions given to patient    Aminolevulinic Acid HCl 20 % SOLR 354 mg - Head - Anterior (Face)      

## 2021-05-11 IMAGING — CT CT RENAL STONE PROTOCOL
2 of 4 series · 17 of 46 positions shown, 19 images · non-contrast
Comparison: None.

CLINICAL DATA: Acute left flank pain.  Gross hematuria.

EXAM:
CT ABDOMEN AND PELVIS WITHOUT CONTRAST
TECHNIQUE: Multidetector CT imaging of the abdomen and pelvis was performed
following the standard protocol without IV contrast.

[Series 2: stone full standard · axial · 0.87mm/px · z∈[-958,-443]mm · 14 of 113 slices shown, 16 images]
[im 5/113  soft-tissue]
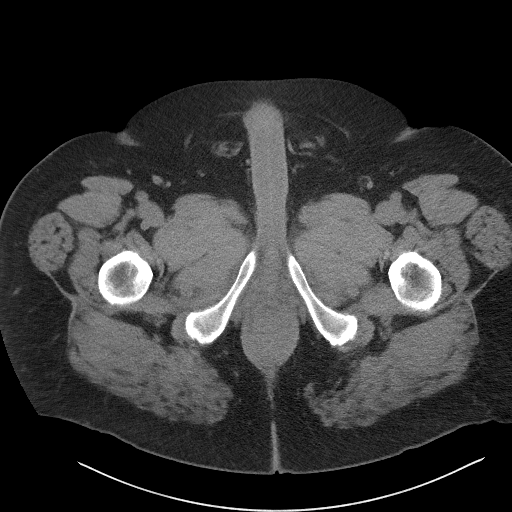
[im 5/113  bone]
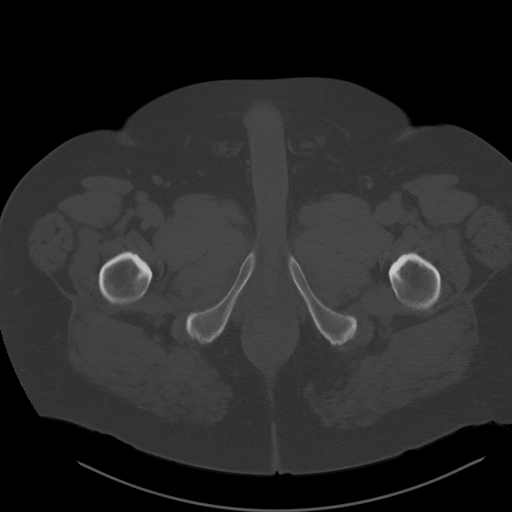
[im 15/113  soft-tissue]
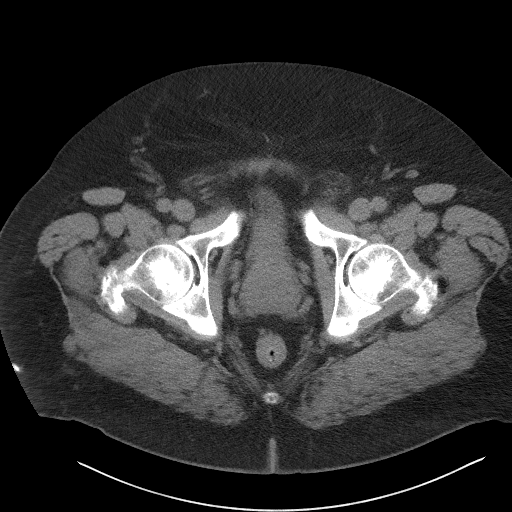
[im 20/113  soft-tissue]
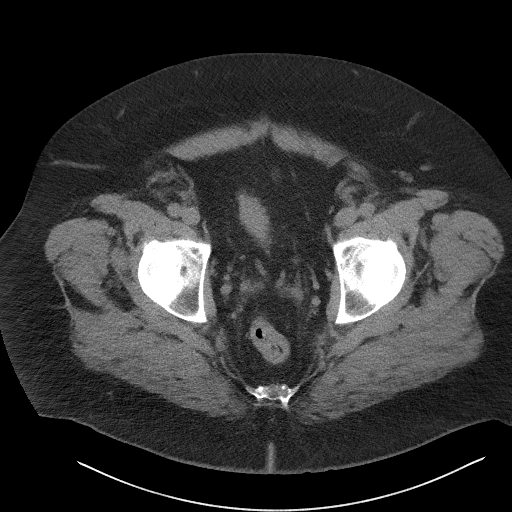
[im 30/113  soft-tissue]
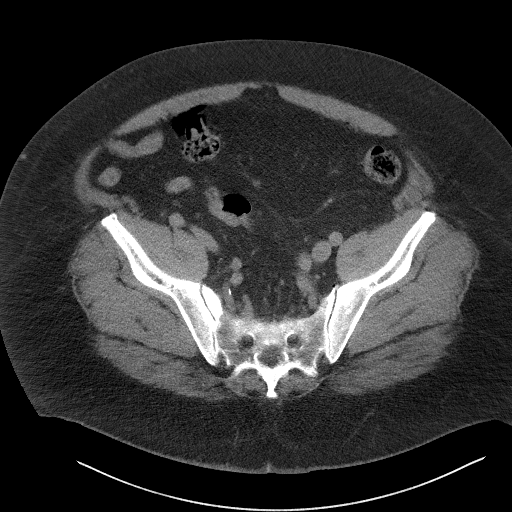
[im 39/113  soft-tissue]
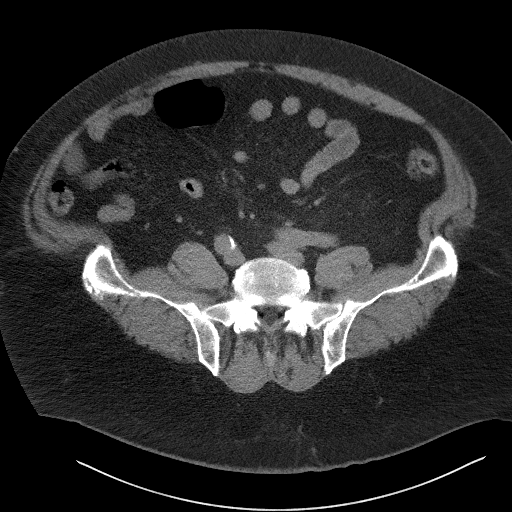
[im 44/113  soft-tissue]
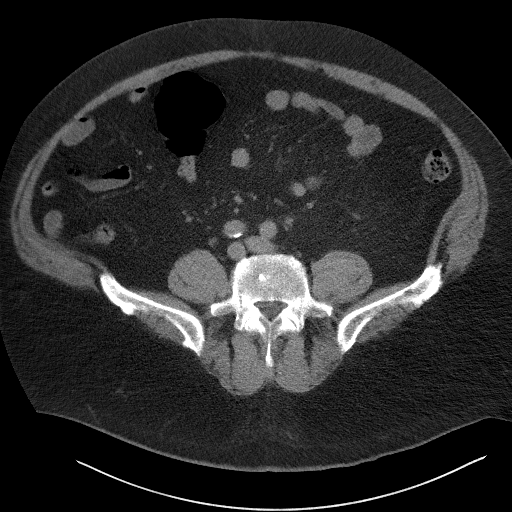
[im 54/113  soft-tissue]
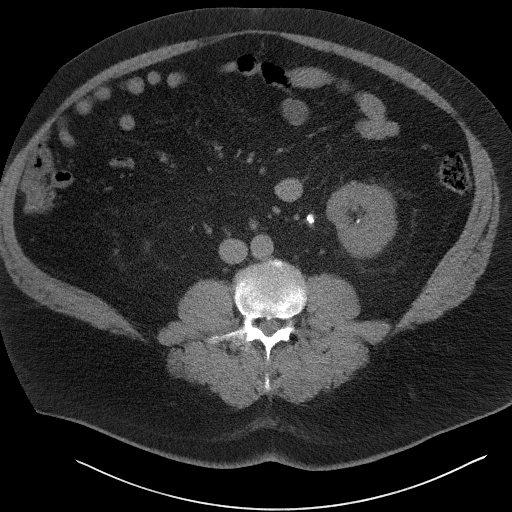
[im 59/113  soft-tissue]
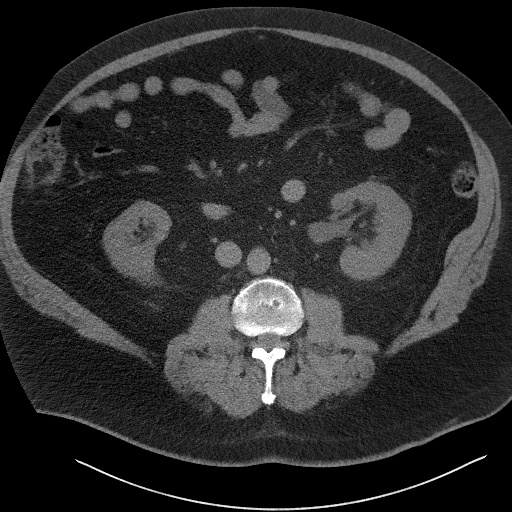
[im 69/113  soft-tissue]
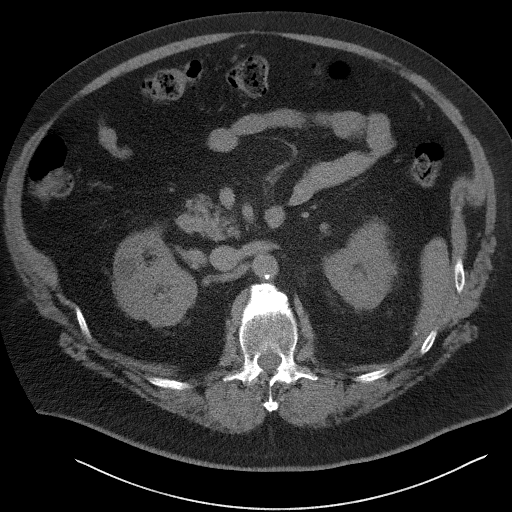
[im 69/113  bone]
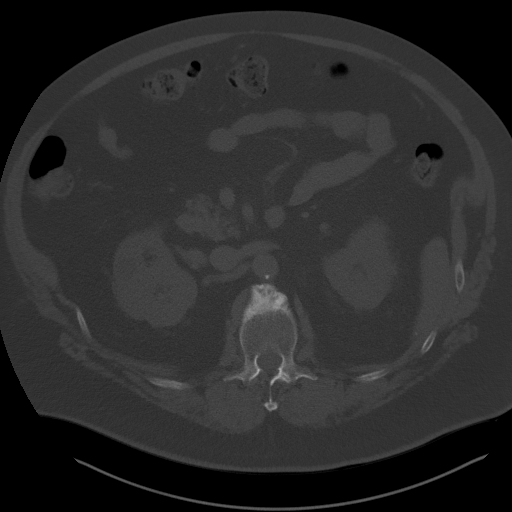
[im 74/113  soft-tissue]
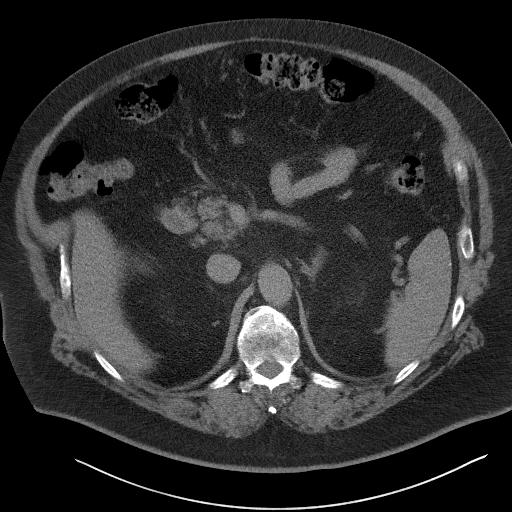
[im 83/113  soft-tissue]
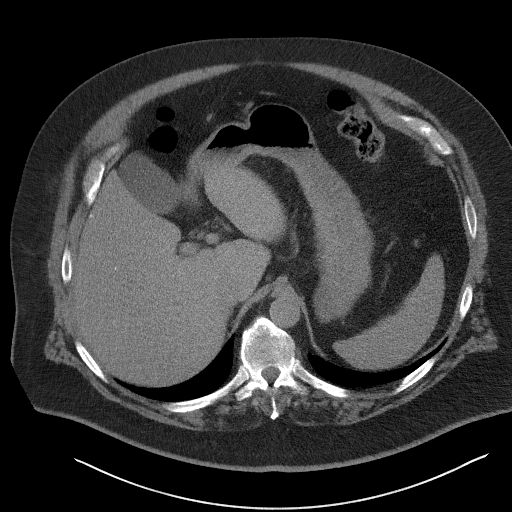
[im 93/113  soft-tissue]
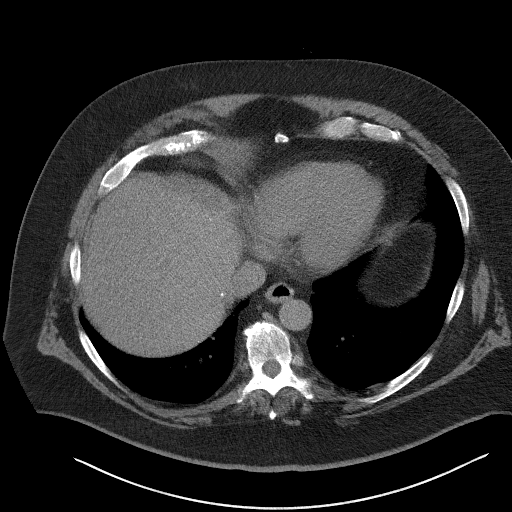
[im 98/113  soft-tissue]
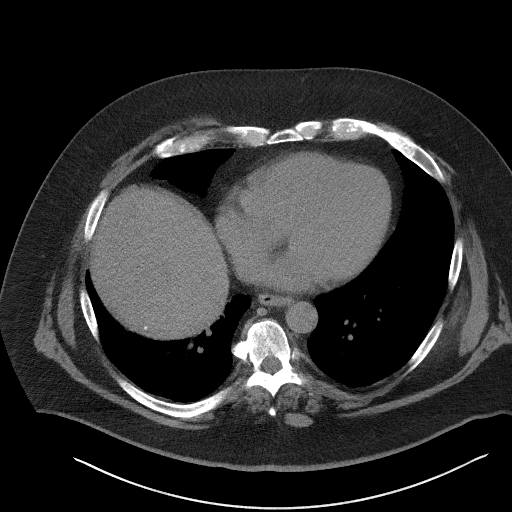
[im 108/113  soft-tissue]
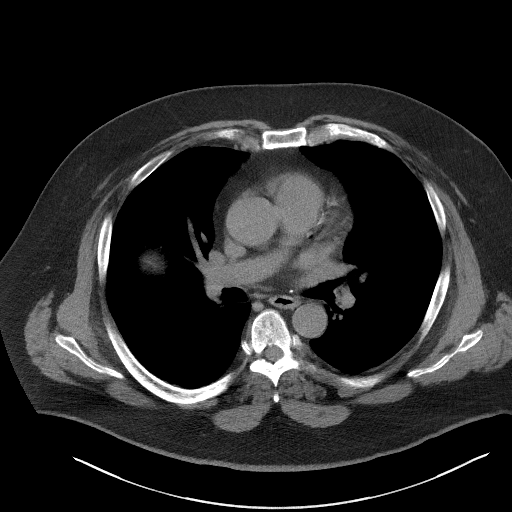

[Series 5: coronal · coronal · 0.98mm/px · 3 of 205 slices shown]
[im 69/205  soft-tissue]
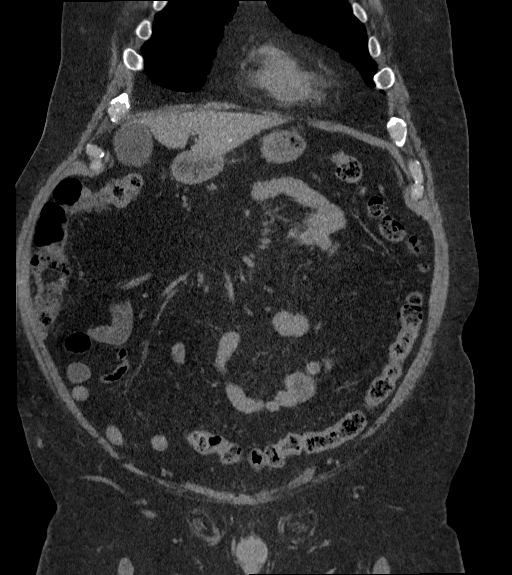
[im 91/205  soft-tissue]
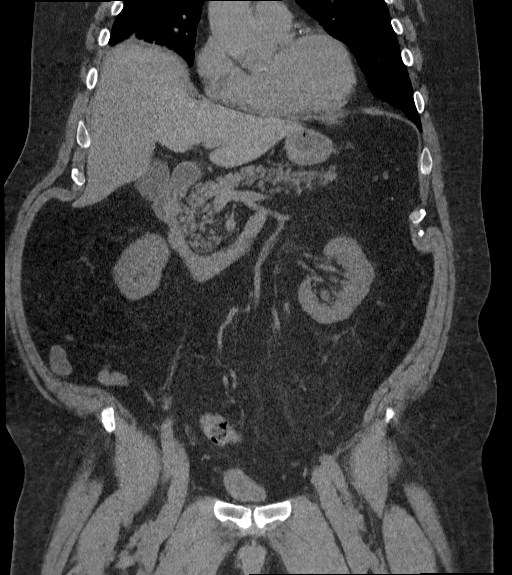
[im 114/205  soft-tissue]
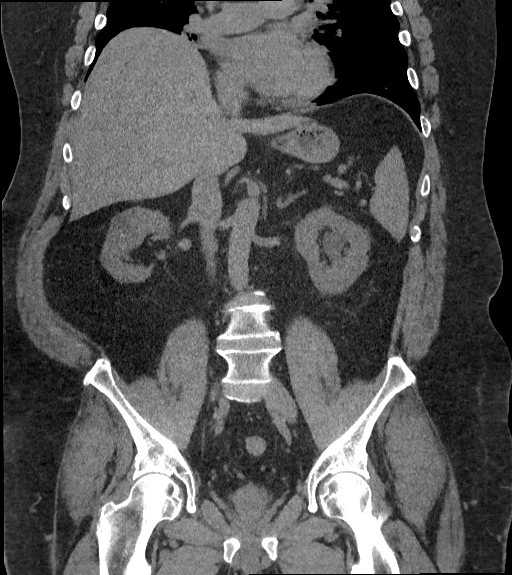

[17 of 46 positions shown; findings below may reference images not displayed]

FINDINGS: Lower chest: No acute abnormality.

Hepatobiliary: No focal liver abnormality is seen. No gallstones,
gallbladder wall thickening, or biliary dilatation.

Pancreas: Unremarkable. No pancreatic ductal dilatation or
surrounding inflammatory changes.

Spleen: Calcified splenic granulomata are noted.

Adrenals/Urinary Tract: Adrenal glands appear normal. Multiple right
renal cysts are noted. Mild left hydronephrosis is noted secondary
to 13 mm calculus at left ureteropelvic junction. Urinary bladder is
decompressed.

Stomach/Bowel: Stomach is within normal limits. Appendix appears
normal. No evidence of bowel wall thickening, distention, or
inflammatory changes.

Vascular/Lymphatic: Aortic atherosclerosis. No enlarged abdominal or
pelvic lymph nodes.

Reproductive: Prostate is unremarkable.

Other: No abdominal wall hernia or abnormality. No abdominopelvic
ascites.

Musculoskeletal: No acute or significant osseous findings.
IMPRESSION: Mild left hydronephrosis secondary to 13 mm calculus at left
ureteropelvic junction.

Aortic Atherosclerosis (80M69-XQ7.7).

## 2021-06-28 IMAGING — US US RENAL
1 series · 14 of 25 positions shown · non-contrast
Comparison: None.

CLINICAL DATA: Left ureteral calculus.

EXAM:
RENAL / URINARY TRACT ULTRASOUND COMPLETE

[Series 1: us renal · 0.29mm/px · 14 of 44 slices shown]
[im 1/44]
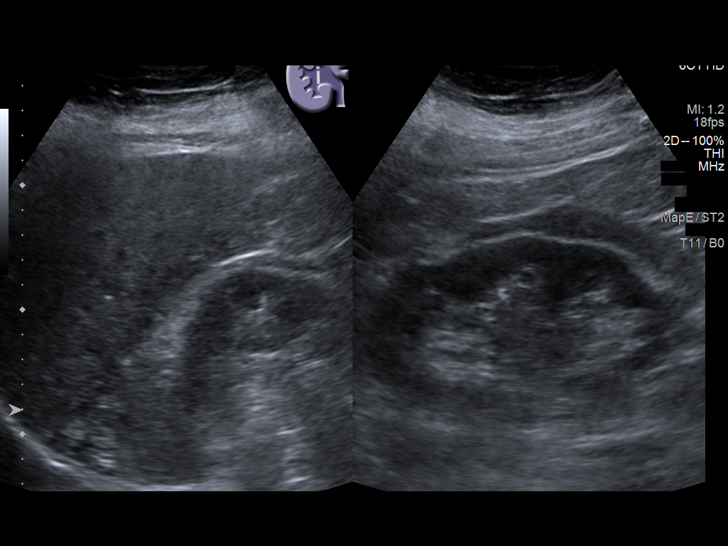
[im 4/44]
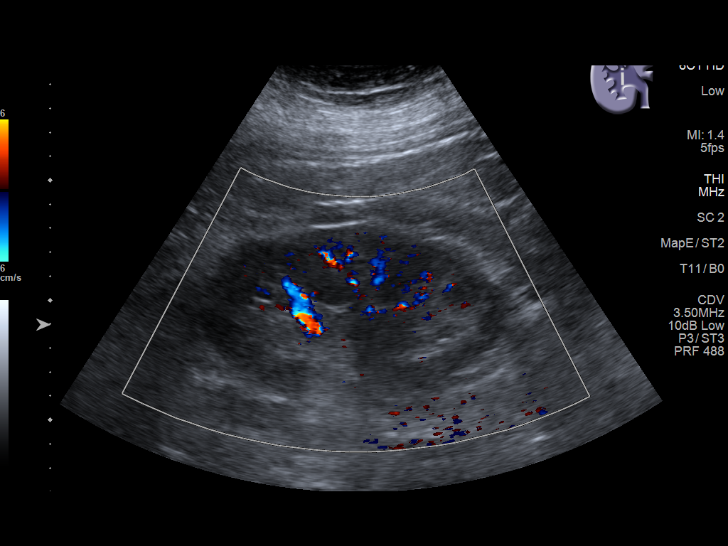
[im 8/44]
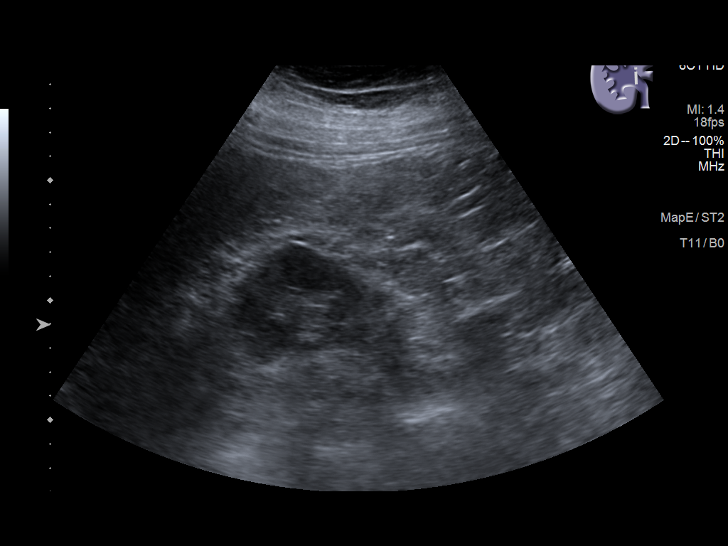
[im 11/44]
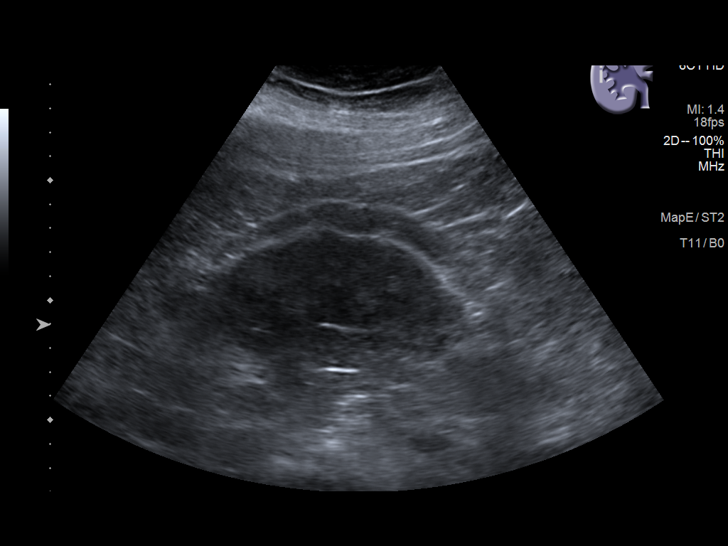
[im 15/44]
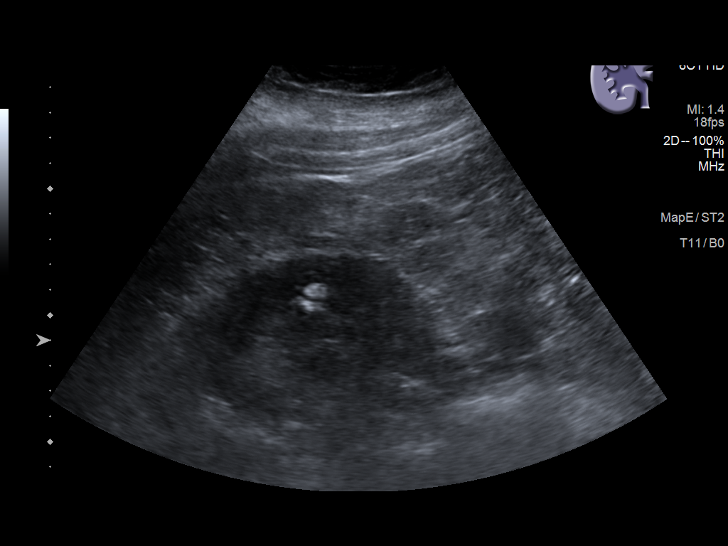
[im 17/44]
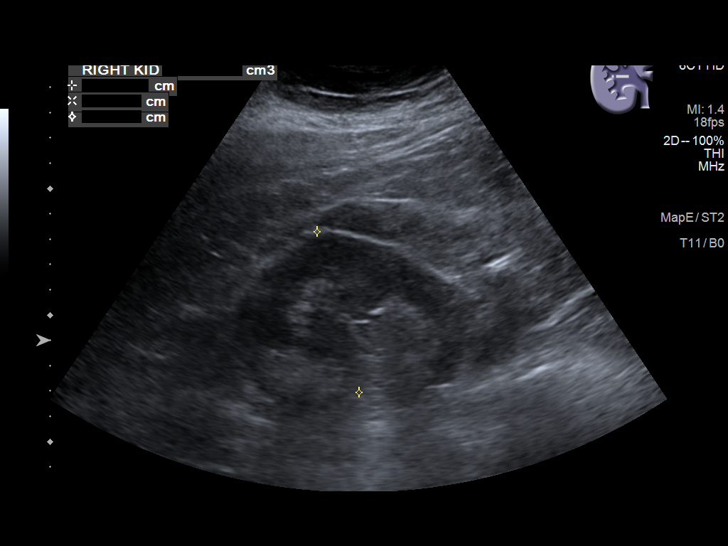
[im 20/44]
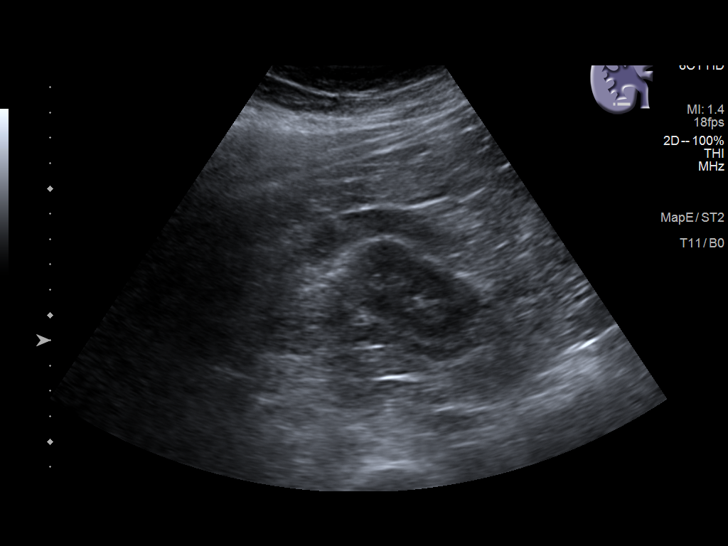
[im 24/44]
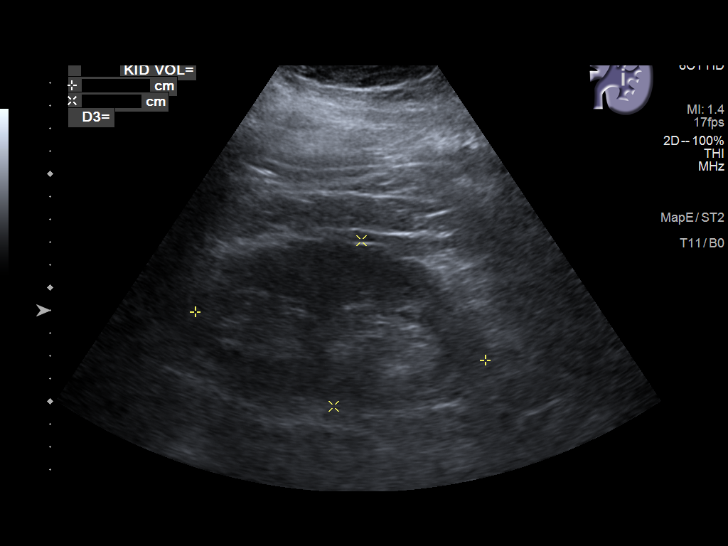
[im 27/44]
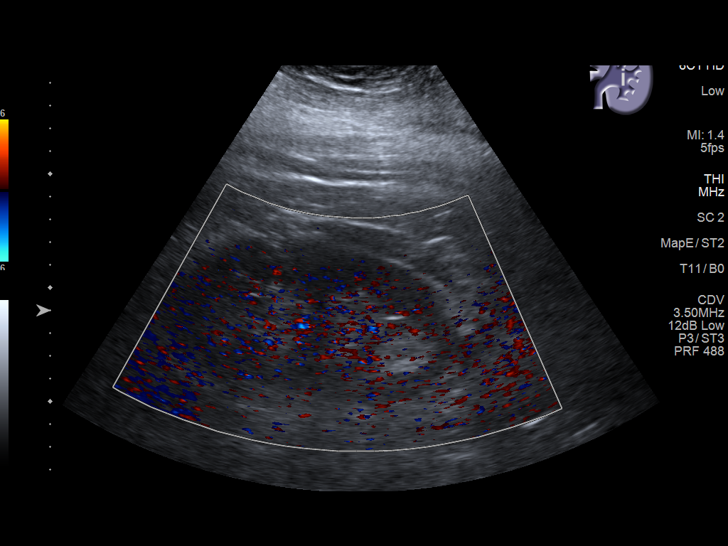
[im 29/44]
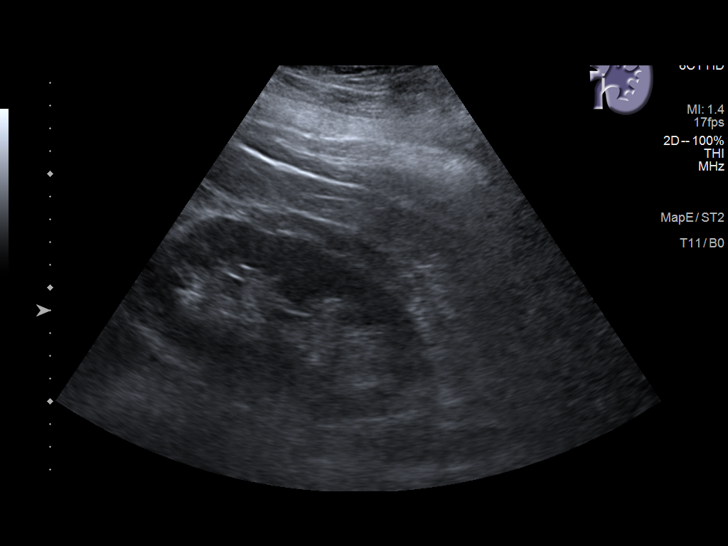
[im 33/44]
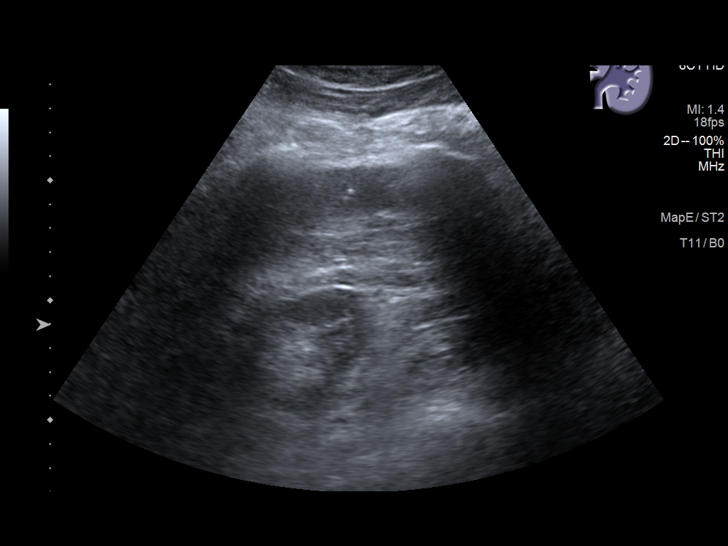
[im 36/44]
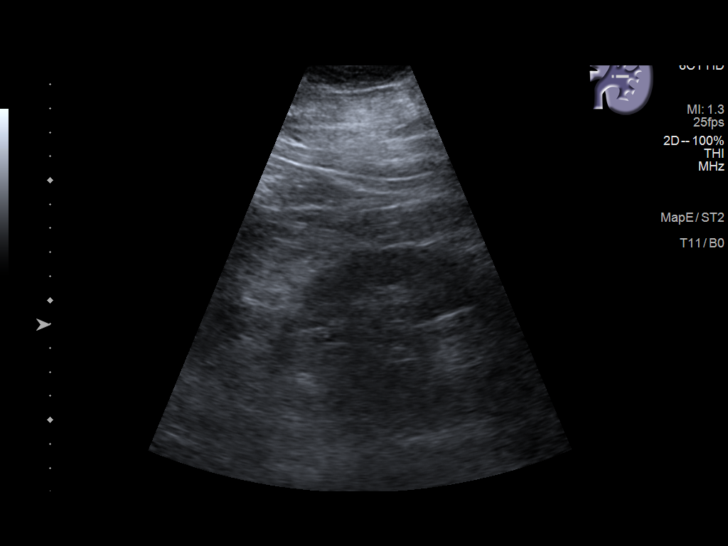
[im 40/44]
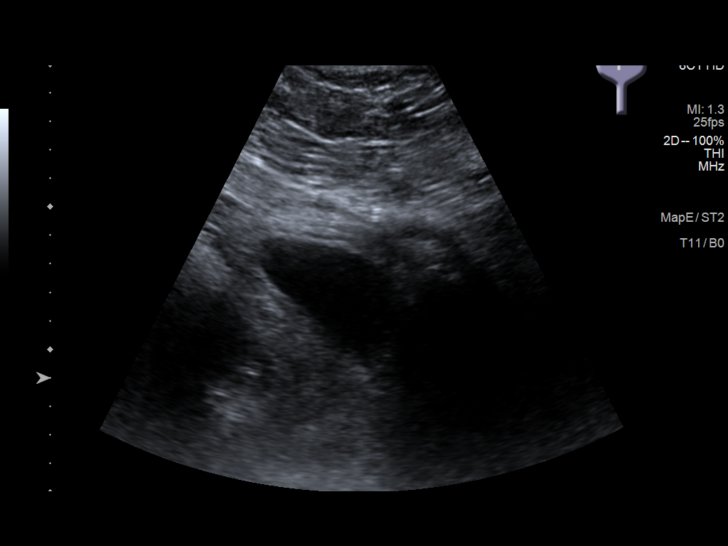
[im 44/44]
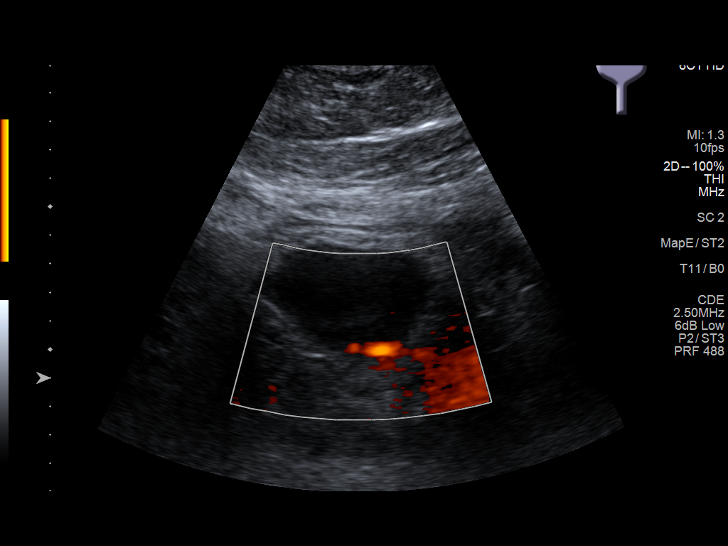

[14 of 25 positions shown; findings below may reference images not displayed]

FINDINGS: Right Kidney:

Renal measurements: 12.4 x 7.7 x 6.6 cm = volume: 327.4 mL. Contains
a 2.1 cm cyst in the upper pole. Several clustered echogenic foci
are seen in the right kidney. A representative focus measures 7 mm.
A few other scattered echogenic nonshadowing foci are noted.

Left Kidney:

Renal measurements: 12.9 x 7.4 x 6.4 cm = volume: 318 mL.
Echogenicity within normal limits. No mass or hydronephrosis
visualized.

Bladder:

Appears normal for degree of bladder distention.
IMPRESSION: 1. There is a 2 cm cyst in the right kidney.
2. Nonshadowing echogenic foci in the right kidney measuring up to 7
mm are identified. There is no definitive correlate on the January 18, 2019 study. These could be stones which have developed in the
short interval versus artifact. No hydronephrosis.
3. The left kidney is normal.  No hydronephrosis.

## 2021-09-03 ENCOUNTER — Other Ambulatory Visit: Payer: Self-pay

## 2021-09-03 ENCOUNTER — Encounter: Payer: Self-pay | Admitting: Podiatry

## 2021-09-03 ENCOUNTER — Ambulatory Visit: Payer: Medicare Other | Admitting: Podiatry

## 2021-09-03 DIAGNOSIS — L989 Disorder of the skin and subcutaneous tissue, unspecified: Secondary | ICD-10-CM | POA: Diagnosis not present

## 2021-09-03 NOTE — Progress Notes (Signed)
? ?HPI: 71 y.o. male presenting today for evaluation of a recurrent skin lesion to the distal tip of the left third toe.  Patient states that the callus is now returned and become painful and symptomatic.  He was last debrided about 1.5 years ago and he has felt better up until just a few months.  He presents for follow-up treatment and evaluation ? ?Past Medical History:  ?Diagnosis Date  ? Arthritis   ? KNEE JOINT  ? Dysplastic nevi 09/03/2017  ? Left mid back paraspinal. Moderate atypia, deep margin involved  ? Dysplastic nevi 12/22/2017  ? Left posterior waistline. Moderate to severe atypia, close to margin. Excised: 12/22/2017, margins free.  ? Family history of adverse reaction to anesthesia   ? MOTHER LOST TASTE AFTER SURGERY  ? History of kidney stones   ? Hx of dysplastic nevus 06/18/2016  ? R ant side at costal margin, mild to moderate atypia  ? Hypertension   ? Melanoma ([place]) 2002  ? Right upper arm  ? Seasonal allergies   ? Sleep apnea   ? CPAP  ? Squamous cell carcinoma in situ   ? left post shoulder, left forearm, left dorsum med forearm  ? Squamous cell carcinoma of skin 09/02/2016  ? Left posterior shoulder. SCCis  ? Squamous cell carcinoma of skin 12/08/2016  ? Left forearm. SCCis  ? Squamous cell carcinoma of skin 07/05/2018  ? Left dorsum medial forearm. SCCis  ? Squamous cell carcinoma of skin 01/18/2020  ? R mid dorsum forearm, excised 01/31/2020  ? ? ?Past Surgical History:  ?Procedure Laterality Date  ? COLONOSCOPY  2005  ? COLONOSCOPY WITH PROPOFOL N/A 02/27/2015  ? Procedure: COLONOSCOPY WITH PROPOFOL;  Surgeon: Lucilla Lame, MD;  Location: [facility];  Service: Endoscopy;  Laterality: N/A;  ? CYSTOSCOPY W/ RETROGRADES Left 01/24/2019  ? Procedure: CYSTOSCOPY WITH RETROGRADE PYELOGRAM;  Surgeon: Hollice Espy, MD;  Location: [facility];  Service: Urology;  Laterality: Left;  ? CYSTOSCOPY W/ RETROGRADES Left 02/07/2019  ? Procedure: CYSTOSCOPY WITH RETROGRADE PYELOGRAM;  Surgeon: Hollice Espy, MD;  Location: [facility];  Service: Urology;  Laterality: Left;  ? CYSTOSCOPY WITH BIOPSY Left 02/07/2019  ? Procedure: CYSTOSCOPY WITH BIOPSY;  Surgeon: Hollice Espy, MD;  Location: [facility];  Service: Urology;  Laterality: Left;  ? CYSTOSCOPY WITH STENT PLACEMENT Left 01/24/2019  ? Procedure: CYSTOSCOPY WITH STENT PLACEMENT;  Surgeon: Hollice Espy, MD;  Location: [facility];  Service: Urology;  Laterality: Left;  ? CYSTOSCOPY/URETEROSCOPY/HOLMIUM LASER/STENT PLACEMENT Left 02/07/2019  ? Procedure: CYSTOSCOPY/URETEROSCOPY/HOLMIUM LASER/STENT PLACEMENT;  Surgeon: Hollice Espy, MD;  Location: [facility];  Service: Urology;  Laterality: Left;  ? ? ?No Known Allergies ?  ?Physical Exam: ?General: The patient is alert and oriented x3 in no acute distress. ? ?Dermatology: Skin is warm, dry and supple bilateral lower extremities. Negative for open lesions or macerations.  Hyperkeratotic preulcerative callus lesion to the distal tip of the left third toe noted ? ?Vascular: Palpable pedal pulses bilaterally. Capillary refill within normal limits.  Negative for any significant edema or erythema ? ?Neurological: Light touch and protective threshold grossly intact ? ?Musculoskeletal Exam: No pedal deformities noted ? ? ?Assessment: ?1.  Preulcerative callus lesion distal tip of the left third toe ? ? ?Plan of Care:  ?1. Patient evaluated.  ?2.  Excisional debridement of the hyperkeratotic preulcerative callus lesion was performed using a 312 scalpel and tissue nipper without incident or bleeding as a courtesy for the patient.  The patient felt significant  relief ?3.  Return to clinic as needed ?  ?  ?Edrick Kins, DPM ?[facility] ? ?Dr. Edrick Kins, DPM  ?  ?[address]                                        ?[place], [place] [address]                ?Office [phone]  ?Fax [phone] ? ? ? ? ?

## 2021-09-25 ENCOUNTER — Ambulatory Visit: Payer: Medicare Other | Admitting: Dermatology

## 2021-09-25 DIAGNOSIS — L820 Inflamed seborrheic keratosis: Secondary | ICD-10-CM

## 2021-09-25 DIAGNOSIS — Z85820 Personal history of malignant melanoma of skin: Secondary | ICD-10-CM

## 2021-09-25 DIAGNOSIS — L821 Other seborrheic keratosis: Secondary | ICD-10-CM

## 2021-09-25 DIAGNOSIS — L918 Other hypertrophic disorders of the skin: Secondary | ICD-10-CM

## 2021-09-25 DIAGNOSIS — D229 Melanocytic nevi, unspecified: Secondary | ICD-10-CM

## 2021-09-25 DIAGNOSIS — L578 Other skin changes due to chronic exposure to nonionizing radiation: Secondary | ICD-10-CM

## 2021-09-25 DIAGNOSIS — Z85828 Personal history of other malignant neoplasm of skin: Secondary | ICD-10-CM

## 2021-09-25 DIAGNOSIS — Z1283 Encounter for screening for malignant neoplasm of skin: Secondary | ICD-10-CM

## 2021-09-25 DIAGNOSIS — Z86018 Personal history of other benign neoplasm: Secondary | ICD-10-CM

## 2021-09-25 DIAGNOSIS — D1800 Hemangioma unspecified site: Secondary | ICD-10-CM

## 2021-09-25 DIAGNOSIS — L570 Actinic keratosis: Secondary | ICD-10-CM

## 2021-09-25 DIAGNOSIS — L814 Other melanin hyperpigmentation: Secondary | ICD-10-CM

## 2021-09-25 NOTE — Patient Instructions (Addendum)

## 2021-09-25 NOTE — Progress Notes (Signed)
? ?Follow-Up Visit ?  ?Subjective  ?Carlos Carroll is a 71 y.o. male who presents for the following: Total body skin exam (Hx of Melanoma R upper arm 2002, hx of SCC/SCCIS, hx of Dysplastic Nevi, hx of Aks, ) and dry spots (L shoulder, pt concerned bc he had SCC in past). ?The patient presents for Upper Body Skin Exam (UBSE) for skin cancer screening and mole check.  The patient has spots, moles and lesions to be evaluated, some may be new or changing and the patient has concerns that these could be cancer.  ? ?The following portions of the chart were reviewed this encounter and updated as appropriate:  ? Tobacco  Allergies  Meds  Problems  Med Hx  Surg Hx  Fam Hx   ?  ?Review of Systems:  No other skin or systemic complaints except as noted in HPI or Assessment and Plan. ? ?Objective  ?Well appearing patient in no apparent distress; mood and affect are within normal limits. ? ?All skin waist up examined. ? ?Right Upper Arm ?Well healed scar with no evidence of recurrence, no lymphadenopathy.  ? ?L post auricular x 1 ?Stuck on waxy paps with erythema ? ?face, ears x 9 (9) ?Pink scaly macules ? ? ?Assessment & Plan  ? ?Lentigines ?- Scattered tan macules ?- Due to sun exposure ?- Benign-appearing, observe ?- Recommend daily broad spectrum sunscreen SPF 30+ to sun-exposed areas, reapply every 2 hours as needed. ?- Call for any changes ? ?Seborrheic Keratoses ?- Stuck-on, waxy, tan-brown papules and/or plaques  ?- Benign-appearing ?- Discussed benign etiology and prognosis. ?- Observe ?- Call for any changes ? ?Melanocytic Nevi ?- Tan-brown and/or pink-flesh-colored symmetric macules and papules ?- Benign appearing on exam today ?- Observation ?- Call clinic for new or changing moles ?- Recommend daily use of broad spectrum spf 30+ sunscreen to sun-exposed areas.  ? ?Hemangiomas ?- Red papules ?- Discussed benign nature ?- Observe ?- Call for any changes ? ?Actinic Damage ?- Chronic condition, secondary to  cumulative UV/sun exposure ?- diffuse scaly erythematous macules with underlying dyspigmentation ?- Recommend daily broad spectrum sunscreen SPF 30+ to sun-exposed areas, reapply every 2 hours as needed.  ?- Staying in the shade or wearing long sleeves, sun glasses (UVA+UVB protection) and wide brim hats (4-inch brim around the entire circumference of the hat) are also recommended for sun protection.  ?- Call for new or changing lesions. ? ?Skin cancer screening performed today. ? ?History of Squamous Cell Carcinoma of the Skin ?- No evidence of recurrence today ?- No lymphadenopathy ?- Recommend regular full body skin exams ?- Recommend daily broad spectrum sunscreen SPF 30+ to sun-exposed areas, reapply every 2 hours as needed.  ?- Call if any new or changing lesions are noted between office visits ? ? History of Dysplastic Nevi ?- No evidence of recurrence today ?- Recommend regular full body skin exams ?- Recommend daily broad spectrum sunscreen SPF 30+ to sun-exposed areas, reapply every 2 hours as needed.  ?- Call if any new or changing lesions are noted between office visits  ? ?Acrochordons (Skin Tags) ?- Fleshy, skin-colored pedunculated papules ?- Benign appearing.  ?- Observe. ?- If desired, they can be removed with an in office procedure that is not covered by insurance. ?- Please call the clinic if you notice any new or changing lesions.  ? ?History of melanoma ?Right Upper Arm ?2002 ?Clear. Observe for recurrence. Call clinic for new or changing lesions.  Recommend regular skin  exams, daily broad-spectrum spf 30+ sunscreen use, and photoprotection.   ? ?Inflamed seborrheic keratosis ?L post auricular x 1 ?Destruction of lesion - L post auricular x 1 ?Complexity: simple   ?Destruction method: cryotherapy   ?Informed consent: discussed and consent obtained   ?Timeout:  patient name, date of birth, surgical site, and procedure verified ?Lesion destroyed using liquid nitrogen: Yes   ?Region frozen until  ice ball extended beyond lesion: Yes   ?Outcome: patient tolerated procedure well with no complications   ?Post-procedure details: wound care instructions given   ? ?AK (actinic keratosis) (9) ?face, ears x 9 ?Destruction of lesion - face, ears x 9 ?Complexity: simple   ?Destruction method: cryotherapy   ?Informed consent: discussed and consent obtained   ?Timeout:  patient name, date of birth, surgical site, and procedure verified ?Lesion destroyed using liquid nitrogen: Yes   ?Region frozen until ice ball extended beyond lesion: Yes   ?Outcome: patient tolerated procedure well with no complications   ?Post-procedure details: wound care instructions given   ? ?Return in about 6 months (around 03/27/2022) for TBSE, Hx of Melanoma, Hx of SCC/SCCIS, Hx of Dysplastic nevi, Hx of AKs. ? ?I, Othelia Pulling, RMA, am acting as scribe for Sarina Ser, MD . ?Documentation: I have reviewed the above documentation for accuracy and completeness, and I agree with the above. ? ?Sarina Ser, MD ? ?

## 2021-10-04 ENCOUNTER — Encounter: Payer: Self-pay | Admitting: Dermatology

## 2022-03-31 ENCOUNTER — Ambulatory Visit: Payer: Medicare Other | Admitting: Dermatology

## 2022-03-31 DIAGNOSIS — Z1283 Encounter for screening for malignant neoplasm of skin: Secondary | ICD-10-CM | POA: Diagnosis not present

## 2022-03-31 DIAGNOSIS — D229 Melanocytic nevi, unspecified: Secondary | ICD-10-CM

## 2022-03-31 DIAGNOSIS — L570 Actinic keratosis: Secondary | ICD-10-CM | POA: Diagnosis not present

## 2022-03-31 DIAGNOSIS — L820 Inflamed seborrheic keratosis: Secondary | ICD-10-CM

## 2022-03-31 DIAGNOSIS — Z8589 Personal history of malignant neoplasm of other organs and systems: Secondary | ICD-10-CM

## 2022-03-31 DIAGNOSIS — Z86018 Personal history of other benign neoplasm: Secondary | ICD-10-CM

## 2022-03-31 DIAGNOSIS — L814 Other melanin hyperpigmentation: Secondary | ICD-10-CM

## 2022-03-31 DIAGNOSIS — L821 Other seborrheic keratosis: Secondary | ICD-10-CM | POA: Diagnosis not present

## 2022-03-31 DIAGNOSIS — L304 Erythema intertrigo: Secondary | ICD-10-CM

## 2022-03-31 DIAGNOSIS — Z85828 Personal history of other malignant neoplasm of skin: Secondary | ICD-10-CM

## 2022-03-31 DIAGNOSIS — L578 Other skin changes due to chronic exposure to nonionizing radiation: Secondary | ICD-10-CM | POA: Diagnosis not present

## 2022-03-31 DIAGNOSIS — Z85820 Personal history of malignant melanoma of skin: Secondary | ICD-10-CM

## 2022-03-31 NOTE — Progress Notes (Signed)
Follow-Up Visit   Subjective  Carlos Carroll is a 71 y.o. male who presents for the following: Annual Exam (6 month tbse hx of melanoma hx of aks, hx of isks, hx of scc. Reports some spots behind knees he would like checked. ). The patient presents for Total-Body Skin Exam (TBSE) for skin cancer screening and mole check.  The patient has spots, moles and lesions to be evaluated, some may be new or changing and the patient has concerns that these could be cancer.  The following portions of the chart were reviewed this encounter and updated as appropriate:  Tobacco  Allergies  Meds  Problems  Med Hx  Surg Hx  Fam Hx     Review of Systems: No other skin or systemic complaints except as noted in HPI or Assessment and Plan.  Objective  Well appearing patient in no apparent distress; mood and affect are within normal limits.  A full examination was performed including scalp, head, eyes, ears, nose, lips, neck, chest, axillae, abdomen, back, buttocks, bilateral upper extremities, bilateral lower extremities, hands, feet, fingers, toes, fingernails, and toenails. All findings within normal limits unless otherwise noted below.  right upper back medial to scapula x 1, left lower back x1, left and right popliteal x24 (26) Erythematous stuck-on, waxy papule or plaque  right earlobe x 1 Erythematous thin papules/macules with gritty scale.    Assessment & Plan  Inflamed seborrheic keratosis (26) right upper back medial to scapula x 1, left lower back x1, left and right popliteal x24  Symptomatic, irritating, patient would like treated.  Destruction of lesion - right upper back medial to scapula x 1, left lower back x1, left and right popliteal x24 Complexity: simple   Destruction method: cryotherapy   Informed consent: discussed and consent obtained   Timeout:  patient name, date of birth, surgical site, and procedure verified Lesion destroyed using liquid nitrogen: Yes   Region  frozen until ice ball extended beyond lesion: Yes   Outcome: patient tolerated procedure well with no complications   Post-procedure details: wound care instructions given    Erythema intertrigo bilateral axilla  Intertrigo is a chronic recurrent rash that occurs in skin fold areas that may be associated with friction; heat; moisture; yeast; fungus; and bacteria.  It is exacerbated by increased movement / activity; sweating; and higher atmospheric temperature.  Patient defers treatment at this time.   Actinic keratosis right earlobe x 1  Actinic keratoses are precancerous spots that appear secondary to cumulative UV radiation exposure/sun exposure over time. They are chronic with expected duration over 1 year. A portion of actinic keratoses will progress to squamous cell carcinoma of the skin. It is not possible to reliably predict which spots will progress to skin cancer and so treatment is recommended to prevent development of skin cancer.  Recommend daily broad spectrum sunscreen SPF 30+ to sun-exposed areas, reapply every 2 hours as needed.  Recommend staying in the shade or wearing long sleeves, sun glasses (UVA+UVB protection) and wide brim hats (4-inch brim around the entire circumference of the hat). Call for new or changing lesions.  Destruction of lesion - right earlobe x 1 Complexity: simple   Destruction method: cryotherapy   Informed consent: discussed and consent obtained   Timeout:  patient name, date of birth, surgical site, and procedure verified Lesion destroyed using liquid nitrogen: Yes   Region frozen until ice ball extended beyond lesion: Yes   Outcome: patient tolerated procedure well with no complications  Post-procedure details: wound care instructions given   Additional details:  Prior to procedure, discussed risks of blister formation, small wound, skin dyspigmentation, or rare scar following cryotherapy. Recommend Vaseline ointment to treated areas while  healing.   Lentigines - Scattered tan macules - Due to sun exposure - Benign-appearing, observe - Recommend daily broad spectrum sunscreen SPF 30+ to sun-exposed areas, reapply every 2 hours as needed. - Call for any changes  Seborrheic Keratoses - Stuck-on, waxy, tan-brown papules and/or plaques  - Benign-appearing - Discussed benign etiology and prognosis. - Observe - Call for any changes  Melanocytic Nevi - Tan-brown and/or pink-flesh-colored symmetric macules and papules - Benign appearing on exam today - Observation - Call clinic for new or changing moles - Recommend daily use of broad spectrum spf 30+ sunscreen to sun-exposed areas.   Hemangiomas - Red papules - Discussed benign nature - Observe - Call for any changes  Actinic Damage - Chronic condition, secondary to cumulative UV/sun exposure - diffuse scaly erythematous macules with underlying dyspigmentation - Recommend daily broad spectrum sunscreen SPF 30+ to sun-exposed areas, reapply every 2 hours as needed.  - Staying in the shade or wearing long sleeves, sun glasses (UVA+UVB protection) and wide brim hats (4-inch brim around the entire circumference of the hat) are also recommended for sun protection.  - Call for new or changing lesions.  History of Squamous Cell Carcinoma in Situ of the Skin  See history  - No evidence of recurrence today - Recommend regular full body skin exams - Recommend daily broad spectrum sunscreen SPF 30+ to sun-exposed areas, reapply every 2 hours as needed.  - Call if any new or changing lesions are noted between office visits  History of Squamous Cell Carcinoma of the Skin Multiple areas see history  - No evidence of recurrence today - No lymphadenopathy - Recommend regular full body skin exams - Recommend daily broad spectrum sunscreen SPF 30+ to sun-exposed areas, reapply every 2 hours as needed.  - Call if any new or changing lesions are noted between office  visits  History of Dysplastic Nevi - No evidence of recurrence today - Recommend regular full body skin exams - Recommend daily broad spectrum sunscreen SPF 30+ to sun-exposed areas, reapply every 2 hours as needed.  - Call if any new or changing lesions are noted between office visits  History of Melanoma - No evidence of recurrence today - No lymphadenopathy - Recommend regular full body skin exams - Recommend daily broad spectrum sunscreen SPF 30+ to sun-exposed areas, reapply every 2 hours as needed.  - Call if any new or changing lesions are noted between office visits  Skin cancer screening performed today. Return in about 1 year (around 04/01/2023) for TBSE.  IRuthell Rummage, CMA, am acting as scribe for Sarina Ser, MD. Documentation: I have reviewed the above documentation for accuracy and completeness, and I agree with the above.  Sarina Ser, MD

## 2022-03-31 NOTE — Patient Instructions (Addendum)
Gentle Skin Care Guide   Amlactin Rapid Relief for moisturizer daily  1. Bathe no more than once a day.  2. Avoid bathing in hot water  3. Use a mild soap like Dove, Vanicream, Cetaphil, CeraVe. Can use Lever 2000 or Cetaphil antibacterial soap  4. Use soap only where you need it. On most days, use it under your arms, between your legs, and on your feet. Let the water rinse other areas unless visibly dirty.  5. When you get out of the bath/shower, use a towel to gently blot your skin dry, don't rub it.  6. While your skin is still a little damp, apply a moisturizing cream such as Vanicream, CeraVe, Cetaphil, Eucerin, Sarna lotion or plain Vaseline Jelly. For hands apply Neutrogena [place] Hand Cream or Excipial Hand Cream.  7. Reapply moisturizer any time you start to itch or feel dry.  8. Sometimes using free and clear laundry detergents can be helpful. Fabric softener sheets should be avoided. Downy Free & Gentle liquid, or any liquid fabric softener that is free of dyes and perfumes, it acceptable to use  9. If your doctor has given you prescription creams you may apply moisturizers over them         Melanoma ABCDEs  Melanoma is the most dangerous type of skin cancer, and is the leading cause of death from skin disease.  You are more likely to develop melanoma if you: Have light-colored skin, light-colored eyes, or red or blond hair Spend a lot of time in the sun Tan regularly, either outdoors or in a tanning bed Have had blistering sunburns, especially during childhood Have a close family member who has had a melanoma Have atypical moles or large birthmarks  Early detection of melanoma is key since treatment is typically straightforward and cure rates are extremely high if we catch it early.   The first sign of melanoma is often a change in a mole or a new dark spot.  The ABCDE system is a way of remembering the signs of melanoma.  A for asymmetry:  The two halves do  not match. B for border:  The edges of the growth are irregular. C for color:  A mixture of colors are present instead of an even brown color. D for diameter:  Melanomas are usually (but not always) greater than 43m - the size of a pencil eraser. E for evolution:  The spot keeps changing in size, shape, and color.  Please check your skin once per month between visits. You can use a small mirror in front and a large mirror behind you to keep an eye on the back side or your body.   If you see any new or changing lesions before your next follow-up, please call to schedule a visit.  Please continue daily skin protection including broad spectrum sunscreen SPF 30+ to sun-exposed areas, reapplying every 2 hours as needed when you're outdoors.   Staying in the shade or wearing long sleeves, sun glasses (UVA+UVB protection) and wide brim hats (4-inch brim around the entire circumference of the hat) are also recommended for sun protection.     Due to recent changes in healthcare laws, you may see results of your pathology and/or laboratory studies on MyChart before the doctors have had a chance to review them. We understand that in some cases there may be results that are confusing or concerning to you. Please understand that not all results are received at the same time and often the  doctors may need to interpret multiple results in order to provide you with the best plan of care or course of treatment. Therefore, we ask that you please give [place] 2 business days to thoroughly review all your results before contacting the office for clarification. Should we see a critical lab result, you will be contacted sooner.   If You Need Anything After Your Visit  If you have any questions or concerns for your doctor, please call our main line at [phone] and press option 4 to reach your doctor's medical assistant. If no one answers, please leave a voicemail as directed and we will return your call as soon as  possible. Messages left after 4 pm will be answered the following business day.   You may also send [place] a message via [place]. We typically respond to MyChart messages within 1-2 business days.  For prescription refills, please ask your pharmacy to contact our office. Our fax number is [phone].  If you have an urgent issue when the clinic is closed that cannot wait until the next business day, you can page your doctor at the number below.    Please note that while we do our best to be available for urgent issues outside of office hours, we are not available 24/7.   If you have an urgent issue and are unable to reach [place], you may choose to seek medical care at your doctor's office, retail clinic, urgent care center, or emergency room.  If you have a medical emergency, please immediately call 911 or go to the emergency department.  Pager Numbers  - Dr. Nehemiah Massed: [phone]  - Dr. Laurence Ferrari: [phone]  - Dr. Nicole Kindred: [phone]  In the event of inclement weather, please call our main line at [phone] for an update on the status of any delays or closures.  Dermatology Medication Tips: Please keep the boxes that topical medications come in in order to help keep track of the instructions about where and how to use these. Pharmacies typically print the medication instructions only on the boxes and not directly on the medication tubes.   If your medication is too expensive, please contact our office at [phone] option 4 or send [place] a message through [place].   We are unable to tell what your co-pay for medications will be in advance as this is different depending on your insurance coverage. However, we may be able to find a substitute medication at lower cost or fill out paperwork to get insurance to cover a needed medication.   If a prior authorization is required to get your medication covered by your insurance company, please allow [place] 1-2 business days to complete this  process.  Drug prices often vary depending on where the prescription is filled and some pharmacies may offer cheaper prices.  The website www.goodrx.com contains coupons for medications through different pharmacies. The prices here do not account for what the cost may be with help from insurance (it may be cheaper with your insurance), but the website can give you the price if you did not use any insurance.  - You can print the associated coupon and take it with your prescription to the pharmacy.  - You may also stop by our office during regular business hours and pick up a GoodRx coupon card.  - If you need your prescription sent electronically to a different pharmacy, notify our office through [facility] or by phone at [phone] option 4.     Si Usted [employer]  Despus de Su Visita  Tambin puede enviarnos un mensaje a travs de MyChart. Por lo general respondemos a los mensajes de MyChart en el transcurso de 1 a 2 das hbiles.  Para renovar recetas, por favor pida a su farmacia que se ponga en contacto con nuestra oficina. Harland Dingwall de fax es [place] [phone].  Si tiene un asunto urgente cuando la clnica est cerrada y que no puede esperar hasta el siguiente da hbil, puede llamar/localizar a su doctor(a) al nmero que aparece a continuacin.   Por favor, tenga en cuenta que aunque hacemos todo lo posible para estar disponibles para asuntos urgentes fuera del horario de [place], no estamos disponibles las 24 horas del da, los 7 das de la [place].   Si tiene un problema urgente y no puede comunicarse con nosotros, puede optar por buscar atencin mdica  en el consultorio de su doctor(a), en una clnica privada, en un centro de atencin urgente o en una sala de emergencias.  Si tiene Engineering geologist, por favor llame inmediatamente al 911 o vaya a la sala de emergencias.  Nmeros de bper  - Dr. Nehemiah Massed: [phone]  - Dra. Moye: [phone]  - Dra.  Nicole Kindred: [phone]  En caso de inclemencias del [place], por favor llame a Johnsie Kindred principal al [phone] para una actualizacin sobre el [place] de cualquier retraso o cierre.  Consejos para la medicacin en dermatologa: Por favor, guarde las cajas en las que vienen los medicamentos de uso tpico para ayudarle a seguir las instrucciones sobre dnde y cmo usarlos. Las farmacias generalmente imprimen las instrucciones del medicamento slo en las cajas y no directamente en los tubos del [place].   Si su medicamento es muy caro, por favor, pngase en contacto con Zigmund Daniel llamando al [phone] y presione la opcin 4 o envenos un mensaje a travs de Pharmacist, community.   No podemos decirle cul ser su copago por los medicamentos por adelantado ya que esto es diferente dependiendo de la cobertura de su seguro. Sin embargo, es posible que podamos encontrar un medicamento sustituto a Electrical engineer un formulario para que el seguro cubra el medicamento que se considera necesario.   Si se requiere una autorizacin previa para que su compaa de seguros [place] su medicamento, por favor permtanos de 1 a 2 das hbiles para completar este proceso.  Los precios de los medicamentos varan con frecuencia dependiendo del Environmental consultant de dnde se surte la receta y alguna farmacias pueden ofrecer precios ms baratos.  El sitio web www.goodrx.com tiene cupones para medicamentos de Airline pilot. Los precios aqu no tienen en cuenta lo que podra costar con la ayuda del seguro (puede ser ms barato con su seguro), pero el sitio web puede darle el precio si no utiliz Research scientist (physical sciences).  - Puede imprimir el cupn correspondiente y llevarlo con su receta a la farmacia.  - Tambin puede pasar por nuestra oficina durante el horario de atencin regular y Charity fundraiser una tarjeta de cupones de GoodRx.  - Si necesita que su receta se enve electrnicamente a una farmacia diferente, informe a nuestra oficina a  travs de MyChart de [place] o por telfono llamando al [phone] y presione la opcin 4.

## 2022-04-06 ENCOUNTER — Encounter: Payer: Self-pay | Admitting: Dermatology

## 2022-06-29 IMAGING — CR DG ABDOMEN 1V
1 series · 3 of 3 positions shown · non-contrast
Comparison: CT 01/18/2019

CLINICAL DATA: Follow-up on kidney stones.  No current complaints.

EXAM:
ABDOMEN - 1 VIEW

[Series 1: t abdomen supine · 0.14mm/px · 3 of 3 slices shown]
[im 1/3]
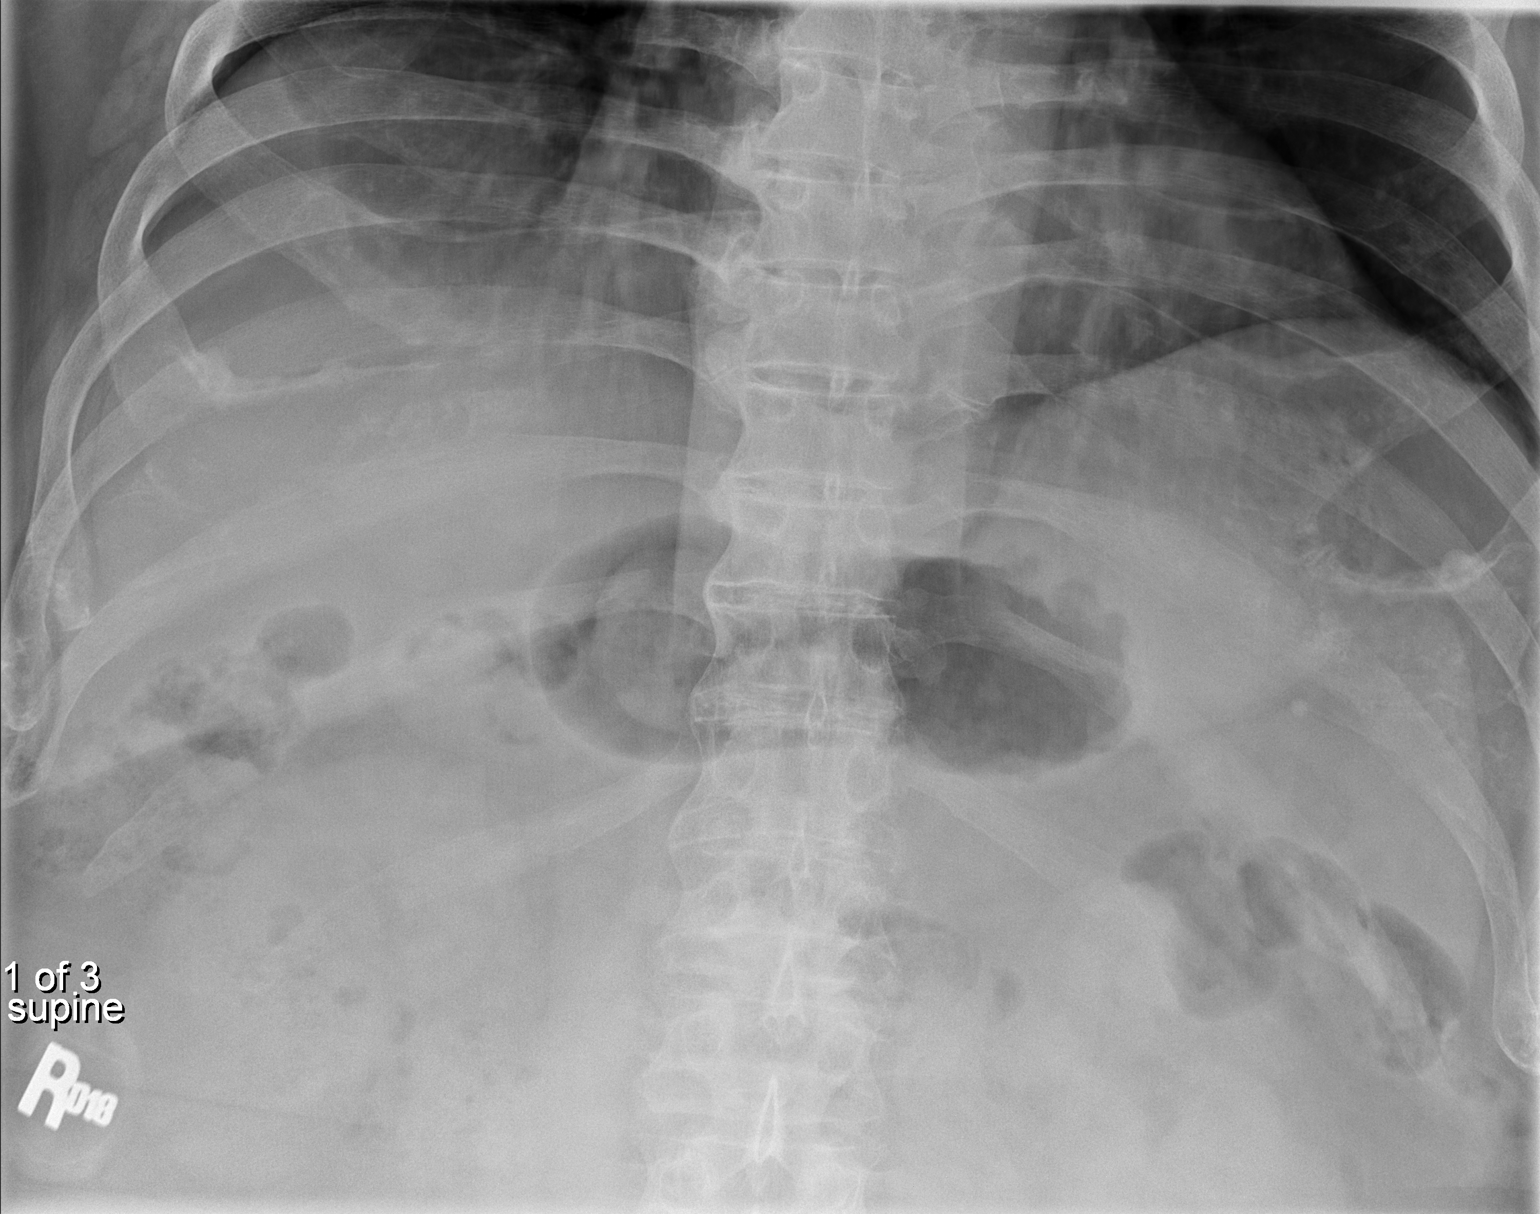
[im 2/3]
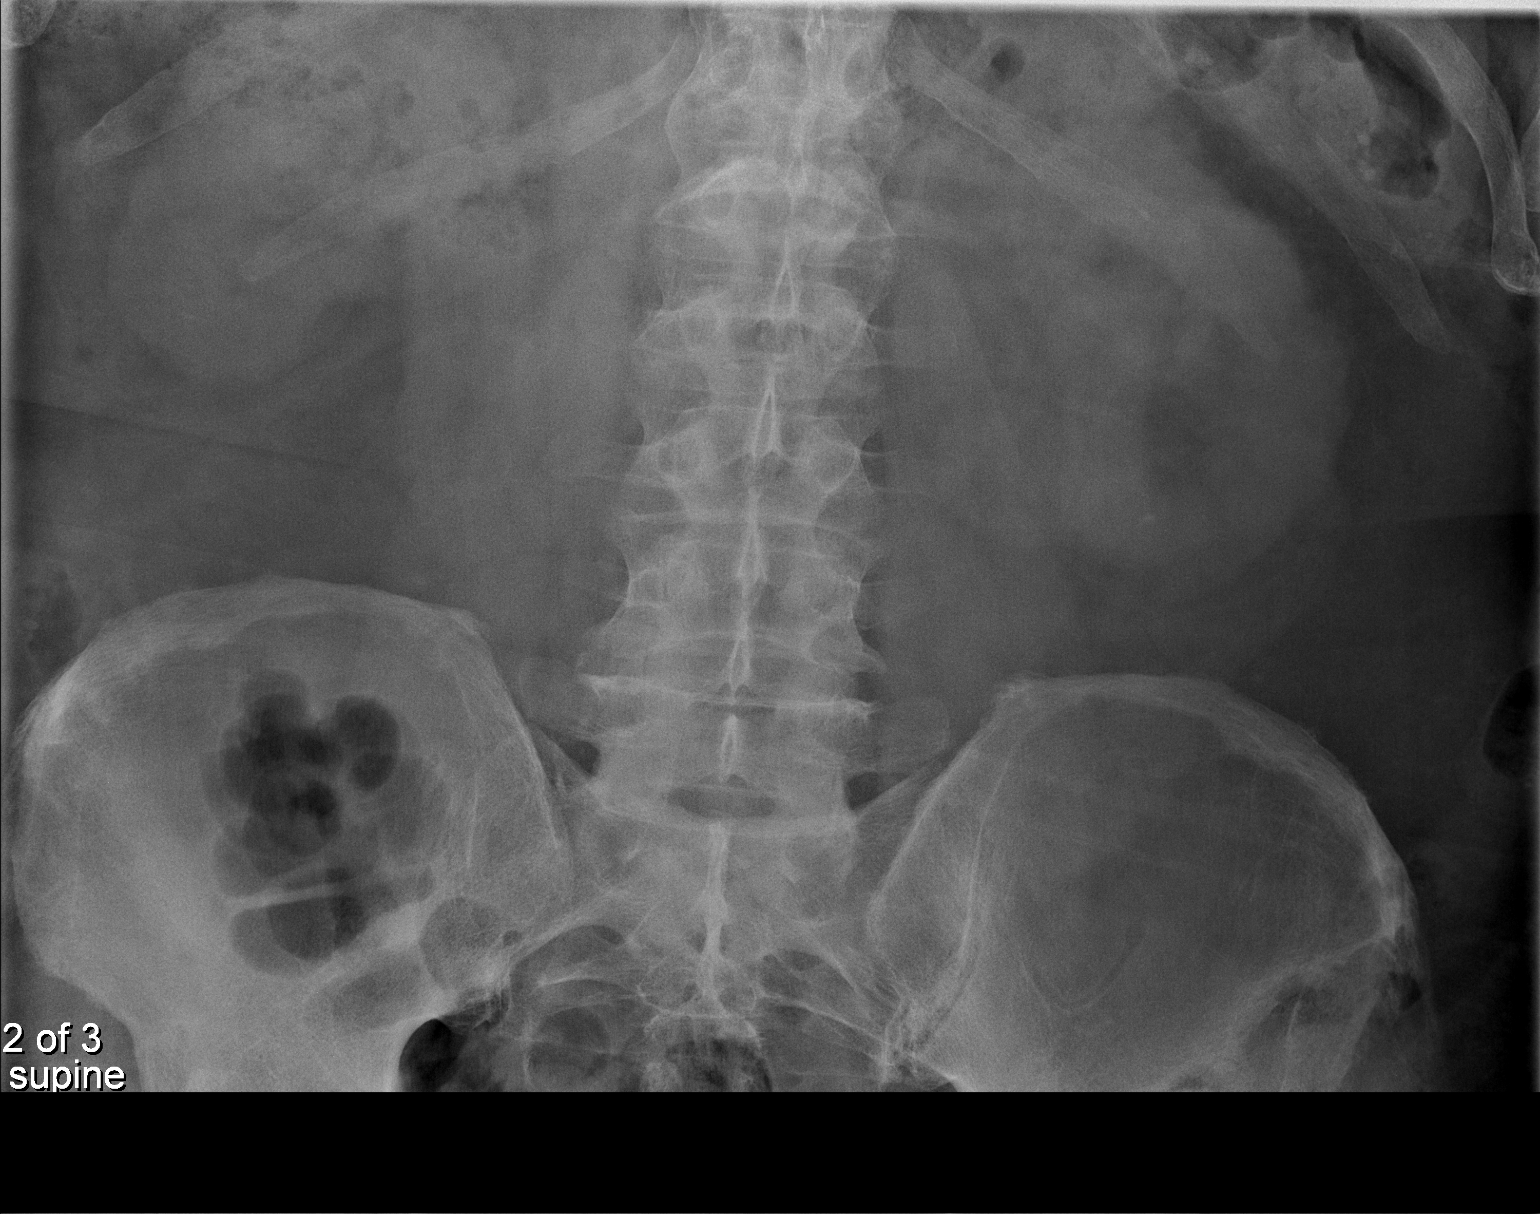
[im 3/3]
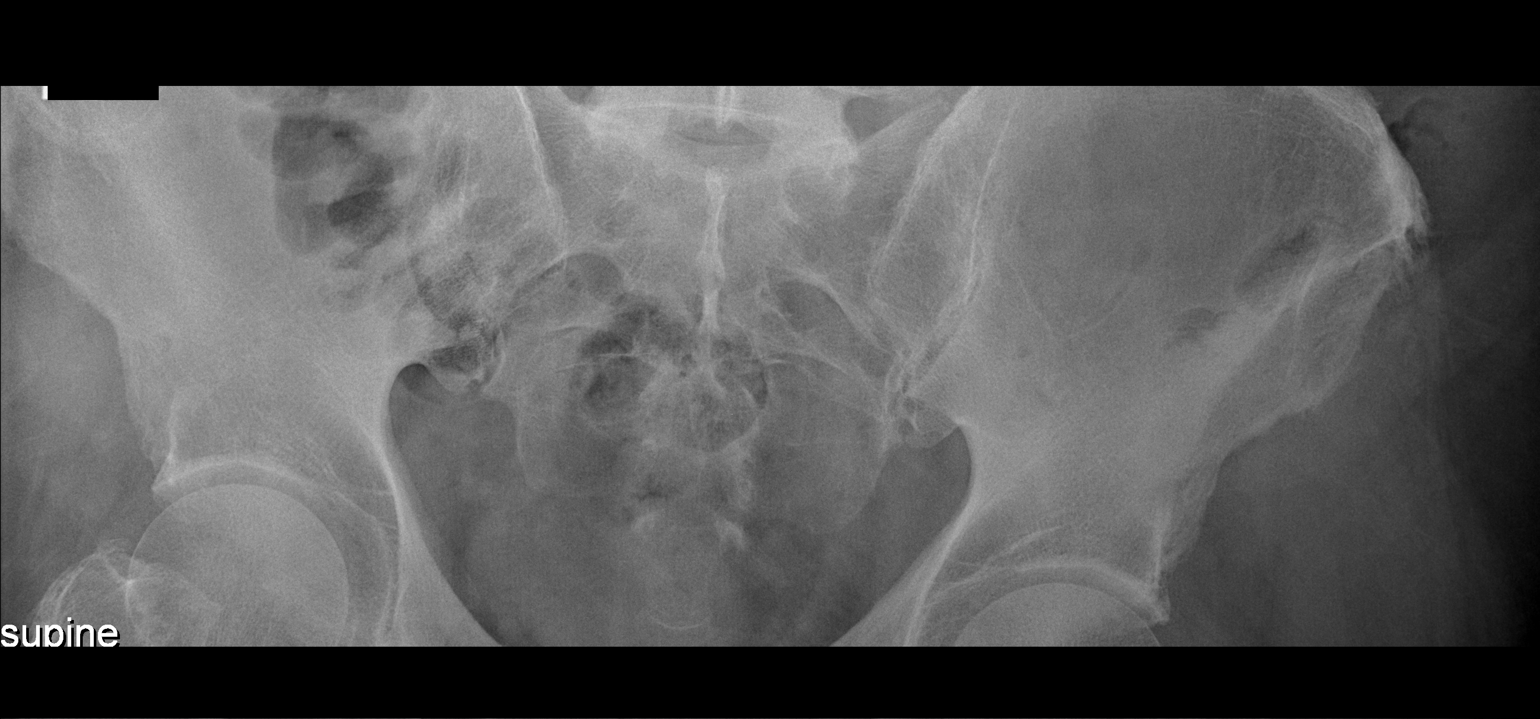

[3 of 3 positions shown; findings below may reference images not displayed]

FINDINGS: Tiny 2 mm stone demonstrated over the left renal lower pole,
correlating with stone seen on the prior CT. No ureteral stones are
identified. Calcified granulomas in the spleen. Scattered gas and
stool in the colon. No small or large bowel distention. Degenerative
changes in the spine and hips.
IMPRESSION: Tiny stone demonstrated over the left renal lower pole. No ureteral
stones identified.

## 2023-04-08 ENCOUNTER — Encounter: Payer: Self-pay | Admitting: Dermatology

## 2023-04-08 ENCOUNTER — Ambulatory Visit: Payer: Medicare Other | Admitting: Dermatology

## 2023-04-08 DIAGNOSIS — Z1283 Encounter for screening for malignant neoplasm of skin: Secondary | ICD-10-CM | POA: Diagnosis not present

## 2023-04-08 DIAGNOSIS — L918 Other hypertrophic disorders of the skin: Secondary | ICD-10-CM

## 2023-04-08 DIAGNOSIS — L821 Other seborrheic keratosis: Secondary | ICD-10-CM

## 2023-04-08 DIAGNOSIS — I781 Nevus, non-neoplastic: Secondary | ICD-10-CM

## 2023-04-08 DIAGNOSIS — Z86007 Personal history of in-situ neoplasm of skin: Secondary | ICD-10-CM

## 2023-04-08 DIAGNOSIS — Z85828 Personal history of other malignant neoplasm of skin: Secondary | ICD-10-CM

## 2023-04-08 DIAGNOSIS — I872 Venous insufficiency (chronic) (peripheral): Secondary | ICD-10-CM

## 2023-04-08 DIAGNOSIS — I8393 Asymptomatic varicose veins of bilateral lower extremities: Secondary | ICD-10-CM

## 2023-04-08 DIAGNOSIS — L814 Other melanin hyperpigmentation: Secondary | ICD-10-CM | POA: Diagnosis not present

## 2023-04-08 DIAGNOSIS — Z85820 Personal history of malignant melanoma of skin: Secondary | ICD-10-CM

## 2023-04-08 DIAGNOSIS — L578 Other skin changes due to chronic exposure to nonionizing radiation: Secondary | ICD-10-CM | POA: Diagnosis not present

## 2023-04-08 DIAGNOSIS — W908XXA Exposure to other nonionizing radiation, initial encounter: Secondary | ICD-10-CM

## 2023-04-08 DIAGNOSIS — D229 Melanocytic nevi, unspecified: Secondary | ICD-10-CM

## 2023-04-08 DIAGNOSIS — L820 Inflamed seborrheic keratosis: Secondary | ICD-10-CM

## 2023-04-08 DIAGNOSIS — D1801 Hemangioma of skin and subcutaneous tissue: Secondary | ICD-10-CM

## 2023-04-08 DIAGNOSIS — Z8589 Personal history of malignant neoplasm of other organs and systems: Secondary | ICD-10-CM

## 2023-04-08 DIAGNOSIS — Z86018 Personal history of other benign neoplasm: Secondary | ICD-10-CM

## 2023-04-08 NOTE — Progress Notes (Signed)
Follow-Up Visit   Subjective  Carlos Carroll is a 72 y.o. male who presents for the following: Skin Cancer Screening and Full Body Skin Exam  The patient presents for Total-Body Skin Exam (TBSE) for skin cancer screening and mole check. The patient has spots, moles and lesions to be evaluated, some may be new or changing and the patient may have concern these could be cancer.   The following portions of the chart were reviewed this encounter and updated as appropriate: medications, allergies, medical history  Review of Systems:  No other skin or systemic complaints except as noted in HPI or Assessment and Plan.  Objective  Well appearing patient in no apparent distress; mood and affect are within normal limits.  A full examination was performed including scalp, head, eyes, ears, nose, lips, neck, chest, axillae, abdomen, back, buttocks, bilateral upper extremities, bilateral lower extremities, hands, feet, fingers, toes, fingernails, and toenails. All findings within normal limits unless otherwise noted below.   Relevant physical exam findings are noted in the Assessment and Plan.  R forearm x 1, sup sternum x 1, L low back x 1 Erythematous stuck-on, waxy papule or plaque    Assessment & Plan   SKIN CANCER SCREENING PERFORMED TODAY.  ACTINIC DAMAGE - Chronic condition, secondary to cumulative UV/sun exposure - diffuse scaly erythematous macules with underlying dyspigmentation - Recommend daily broad spectrum sunscreen SPF 30+ to sun-exposed areas, reapply every 2 hours as needed.  - Staying in the shade or wearing long sleeves, sun glasses (UVA+UVB protection) and wide brim hats (4-inch brim around the entire circumference of the hat) are also recommended for sun protection.  - Call for new or changing lesions.  LENTIGINES, SEBORRHEIC KERATOSES, HEMANGIOMAS - Benign normal skin lesions - Benign-appearing - Call for any changes  MELANOCYTIC NEVI - Tan-brown and/or  pink-flesh-colored symmetric macules and papules - Benign appearing on exam today - Observation - Call clinic for new or changing moles - Recommend daily use of broad spectrum spf 30+ sunscreen to sun-exposed areas.   HISTORY OF SQUAMOUS CELL CARCINOMA OF THE SKIN - No evidence of recurrence today - No lymphadenopathy - Recommend regular full body skin exams - Recommend daily broad spectrum sunscreen SPF 30+ to sun-exposed areas, reapply every 2 hours as needed.  - Call if any new or changing lesions are noted between office visits  HISTORY OF SQUAMOUS CELL CARCINOMA IN SITU OF THE SKIN - No evidence of recurrence today - Recommend regular full body skin exams - Recommend daily broad spectrum sunscreen SPF 30+ to sun-exposed areas, reapply every 2 hours as needed.  - Call if any new or changing lesions are noted between office visits  HISTORY OF DYSPLASTIC NEVUS No evidence of recurrence today Recommend regular full body skin exams Recommend daily broad spectrum sunscreen SPF 30+ to sun-exposed areas, reapply every 2 hours as needed.  Call if any new or changing lesions are noted between office visits  HISTORY OF MELANOMA - No evidence of recurrence today - No lymphadenopathy - Recommend regular full body skin exams - Recommend daily broad spectrum sunscreen SPF 30+ to sun-exposed areas, reapply every 2 hours as needed.  - Call if any new or changing lesions are noted between office visits  HISTORY OF BASAL CELL CARCINOMA OF THE SKIN - No evidence of recurrence today - Recommend regular full body skin exams - Recommend daily broad spectrum sunscreen SPF 30+ to sun-exposed areas, reapply every 2 hours as needed.  - Call if  any new or changing lesions are noted between office visits  Inflamed seborrheic keratosis R forearm x 1, sup sternum x 1, L low back x 1  Symptomatic, irritating, patient would like treated.   Destruction of lesion - R forearm x 1, sup sternum x 1, L low  back x 1 Complexity: simple   Destruction method: cryotherapy   Informed consent: discussed and consent obtained   Timeout:  patient name, date of birth, surgical site, and procedure verified Lesion destroyed using liquid nitrogen: Yes   Region frozen until ice ball extended beyond lesion: Yes   Outcome: patient tolerated procedure well with no complications   Post-procedure details: wound care instructions given    Acrochordons (Skin Tags) - Fleshy, skin-colored pedunculated papules - Benign appearing.  - Observe. - If desired, they can be removed with an in office procedure that is not covered by insurance. - Please call the clinic if you notice any new or changing lesions.  Varicose Veins/Spider Veins - Dilated blue, purple or red veins at the lower extremities - Reassured - Smaller vessels can be treated by sclerotherapy (a procedure to inject a medicine into the veins to make them disappear) if desired, but the treatment is not covered by insurance. Larger vessels may be covered if symptomatic and we would refer to vascular surgeon if treatment desired.  STASIS DERMATITIS Exam: Erythematous, scaly patches involving the ankle and distal lower leg with associated lower leg edema.  Chronic and persistent condition with duration or expected duration over one year. Condition is bothersome/symptomatic for patient. Currently flared.  Stasis in the legs causes chronic leg swelling, which may result in itchy or painful rashes, skin discoloration, skin texture changes, and sometimes ulceration.  Recommend daily graduated compression hose/stockings- easiest to put on first thing in morning, remove at bedtime.  Elevate legs as much as possible. Avoid salt/sodium rich foods.  Treatment Plan: Mild, not tx needed.  Return in about 1 year (around 04/07/2024) for TBSE.  Carlos Carroll, CMA, am acting as scribe for Carlos Sans, MD .   Documentation: I have reviewed the above documentation  for accuracy and completeness, and I agree with the above.  Carlos Sans, MD

## 2023-04-08 NOTE — Patient Instructions (Signed)

## 2023-04-18 ENCOUNTER — Encounter: Payer: Self-pay | Admitting: Dermatology

## 2023-12-04 ENCOUNTER — Encounter: Payer: Self-pay | Admitting: Podiatry

## 2023-12-04 ENCOUNTER — Ambulatory Visit: Admitting: Podiatry

## 2023-12-04 VITALS — Ht 69.0 in | Wt 360.0 lb

## 2023-12-04 DIAGNOSIS — D2372 Other benign neoplasm of skin of left lower limb, including hip: Secondary | ICD-10-CM

## 2023-12-04 NOTE — Progress Notes (Signed)
 HPI: 73 y.o. male presenting today for evaluation of a recurrent skin lesion to the distal tip of the left third toe.  Patient states that the callus is now returned and become painful and symptomatic.  He was last debrided about 1.5 years ago and he has felt better up until just a few months.  He presents for follow-up treatment and evaluation  Past Medical History:  Diagnosis Date   Arthritis    KNEE JOINT   Dysplastic nevi 09/03/2017   Left mid back paraspinal. Moderate atypia, deep margin involved   Dysplastic nevi 12/22/2017   Left posterior waistline. Moderate to severe atypia, close to margin. Excised: 12/22/2017, margins free.   Family history of adverse reaction to anesthesia    MOTHER LOST TASTE AFTER SURGERY   History of kidney stones    Hx of dysplastic nevus 06/18/2016   R ant side at costal margin, mild to moderate atypia   Hypertension    Melanoma (HCC) 2002   Right upper arm   Seasonal allergies    Sleep apnea    CPAP   Squamous cell carcinoma in situ    left post shoulder, left forearm, left dorsum med forearm   Squamous cell carcinoma of skin 09/02/2016   Left posterior shoulder. SCCis   Squamous cell carcinoma of skin 12/08/2016   Left forearm. SCCis   Squamous cell carcinoma of skin 07/05/2018   Left dorsum medial forearm. SCCis   Squamous cell carcinoma of skin 01/18/2020   R mid dorsum forearm, excised 01/31/2020    Past Surgical History:  Procedure Laterality Date   COLONOSCOPY  2005   COLONOSCOPY WITH PROPOFOL  N/A 02/27/2015   Procedure: COLONOSCOPY WITH PROPOFOL ;  Surgeon: Rogelia Copping, MD;  Location: [facility];  Service: Endoscopy;  Laterality: N/A;   CYSTOSCOPY W/ RETROGRADES Left 01/24/2019   Procedure: CYSTOSCOPY WITH RETROGRADE PYELOGRAM;  Surgeon: Penne Knee, MD;  Location: [facility];  Service: Urology;  Laterality: Left;   CYSTOSCOPY W/ RETROGRADES Left 02/07/2019   Procedure: CYSTOSCOPY WITH RETROGRADE PYELOGRAM;  Surgeon: Penne Knee, MD;  Location: [facility];  Service: Urology;  Laterality: Left;   CYSTOSCOPY WITH BIOPSY Left 02/07/2019   Procedure: CYSTOSCOPY WITH BIOPSY;  Surgeon: Penne Knee, MD;  Location: [facility];  Service: Urology;  Laterality: Left;   CYSTOSCOPY WITH STENT PLACEMENT Left 01/24/2019   Procedure: CYSTOSCOPY WITH STENT PLACEMENT;  Surgeon: Penne Knee, MD;  Location: [facility];  Service: Urology;  Laterality: Left;   CYSTOSCOPY/URETEROSCOPY/HOLMIUM LASER/STENT PLACEMENT Left 02/07/2019   Procedure: CYSTOSCOPY/URETEROSCOPY/HOLMIUM LASER/STENT PLACEMENT;  Surgeon: Penne Knee, MD;  Location: [facility];  Service: Urology;  Laterality: Left;    No Known Allergies   Physical Exam: General: The patient is alert and oriented x3 in no acute distress.  Dermatology: Skin is warm, dry and supple bilateral lower extremities. Negative for open lesions or macerations.  Hyperkeratotic preulcerative callus lesion to the distal tip of the left third toe noted  Vascular: Palpable pedal pulses bilaterally. Capillary refill within normal limits.  Negative for any significant edema or erythema  Neurological: Light touch and protective threshold grossly intact  Musculoskeletal Exam: No pedal deformities noted   Assessment: 1.  Preulcerative callus lesion distal tip of the left third toe   Plan of Care:  1. Patient evaluated.  2.  Excisional debridement of the hyperkeratotic preulcerative callus lesion was performed using a 312 scalpel and tissue nipper without incident or bleeding as a courtesy for the patient.  The patient felt significant  relief 3.  Return to clinic as needed     Thresa EMERSON Sar, DPM [facility]  Dr. Thresa EMERSON Sar, DPM    [address] [place], [place] [address]                Office [phone]  Fax [phone]

## 2024-04-04 ENCOUNTER — Ambulatory Visit: Admitting: Dermatology

## 2024-04-04 ENCOUNTER — Encounter: Payer: Self-pay | Admitting: Dermatology

## 2024-04-04 DIAGNOSIS — Z85820 Personal history of malignant melanoma of skin: Secondary | ICD-10-CM

## 2024-04-04 DIAGNOSIS — L814 Other melanin hyperpigmentation: Secondary | ICD-10-CM

## 2024-04-04 DIAGNOSIS — L820 Inflamed seborrheic keratosis: Secondary | ICD-10-CM

## 2024-04-04 DIAGNOSIS — L219 Seborrheic dermatitis, unspecified: Secondary | ICD-10-CM

## 2024-04-04 DIAGNOSIS — D229 Melanocytic nevi, unspecified: Secondary | ICD-10-CM

## 2024-04-04 DIAGNOSIS — L821 Other seborrheic keratosis: Secondary | ICD-10-CM

## 2024-04-04 DIAGNOSIS — Z1283 Encounter for screening for malignant neoplasm of skin: Secondary | ICD-10-CM | POA: Diagnosis not present

## 2024-04-04 DIAGNOSIS — Z8589 Personal history of malignant neoplasm of other organs and systems: Secondary | ICD-10-CM

## 2024-04-04 DIAGNOSIS — Z85828 Personal history of other malignant neoplasm of skin: Secondary | ICD-10-CM

## 2024-04-04 DIAGNOSIS — L304 Erythema intertrigo: Secondary | ICD-10-CM

## 2024-04-04 DIAGNOSIS — L578 Other skin changes due to chronic exposure to nonionizing radiation: Secondary | ICD-10-CM

## 2024-04-04 DIAGNOSIS — L817 Pigmented purpuric dermatosis: Secondary | ICD-10-CM

## 2024-04-04 DIAGNOSIS — L570 Actinic keratosis: Secondary | ICD-10-CM | POA: Diagnosis not present

## 2024-04-04 DIAGNOSIS — Z7189 Other specified counseling: Secondary | ICD-10-CM

## 2024-04-04 DIAGNOSIS — Z86018 Personal history of other benign neoplasm: Secondary | ICD-10-CM

## 2024-04-04 DIAGNOSIS — Z79899 Other long term (current) drug therapy: Secondary | ICD-10-CM

## 2024-04-04 DIAGNOSIS — W908XXA Exposure to other nonionizing radiation, initial encounter: Secondary | ICD-10-CM

## 2024-04-04 MED ORDER — FLUOCINOLONE ACETONIDE 0.01 % OT OIL: TOPICAL_OIL | OTIC | 1 refills | Status: AC

## 2024-04-04 NOTE — Progress Notes (Signed)
 Follow-Up Visit   Subjective  Carlos Carroll is a 73 y.o. male who presents for the following: Skin Cancer Screening and Full Body Skin Exam Hx of sccis, hx of bcc, hx of dyplastic nevi , hx of aks and hx of isks Patient reports some spots on arms and some itch in left ear canal  The patient presents for Total-Body Skin Exam (TBSE) for skin cancer screening and mole check. The patient has spots, moles and lesions to be evaluated, some may be new or changing and the patient may have concern these could be cancer.  The following portions of the chart were reviewed this encounter and updated as appropriate: medications, allergies, medical history  Review of Systems:  No other skin or systemic complaints except as noted in HPI or Assessment and Plan.  Objective  Well appearing patient in no apparent distress; mood and affect are within normal limits.  A full examination was performed including scalp, head, eyes, ears, nose, lips, neck, chest, axillae, abdomen, back, buttocks, bilateral upper extremities, bilateral lower extremities, hands, feet, fingers, toes, fingernails, and toenails. All findings within normal limits unless otherwise noted below.   Relevant physical exam findings are noted in the Assessment and Plan.  chest / arms / face x 26 (26) Erythematous stuck-on, waxy papule or plaque face x 11 (11) Erythematous thin papules/macules with gritty scale.   Assessment & Plan   HISTORY OF MELANOMA 2002 - right upper arm  - No evidence of recurrence today - No lymphadenopathy - Recommend regular full body skin exams - Recommend daily broad spectrum sunscreen SPF 30+ to sun-exposed areas, reapply every 2 hours as needed.  - Call if any new or changing lesions are noted between office visits  HISTORY OF SQUAMOUS CELL CARCINOMA IN SITU OF THE SKIN 01/18/2020 right mid dorsum forearm exc 01/31/2020 07/05/2018 left dorsum medial forearm  12/08/2016 - left forearm  09/02/2016 left  posterior shoulder  - No evidence of recurrence today - Recommend regular full body skin exams - Recommend daily broad spectrum sunscreen SPF 30+ to sun-exposed areas, reapply every 2 hours as needed.  - Call if any new or changing lesions are noted between office visits   HISTORY OF BASAL CELL CARCINOMA OF THE SKIN 2 at forearms treated in [place]  - No evidence of recurrence today - Recommend regular full body skin exams - Recommend daily broad spectrum sunscreen SPF 30+ to sun-exposed areas, reapply every 2 hours as needed.  - Call if any new or changing lesions are noted between office visits   HISTORY OF DYSPLASTIC NEVUS 12/22/2017 left posterior waistline - moderate to severe close to margin excised 12/22/2017 margins free 09/03/2017 left mid back paraspinal - moderate atypia deep margin envolved  06/26/2016 right anterior side at costal margin - mild to moderate  No evidence of recurrence today Recommend regular full body skin exams Recommend daily broad spectrum sunscreen SPF 30+ to sun-exposed areas, reapply every 2 hours as needed.  Call if any new or changing lesions are noted between office visits   SKIN CANCER SCREENING PERFORMED TODAY.  ACTINIC DAMAGE - Chronic condition, secondary to cumulative UV/sun exposure - diffuse scaly erythematous macules with underlying dyspigmentation - Recommend daily broad spectrum sunscreen SPF 30+ to sun-exposed areas, reapply every 2 hours as needed.  - Staying in the shade or wearing long sleeves, sun glasses (UVA+UVB protection) and wide brim hats (4-inch brim around the entire circumference of the hat) are also recommended for sun protection.  -  Call for new or changing lesions.  LENTIGINES, SEBORRHEIC KERATOSES, HEMANGIOMAS - Benign normal skin lesions - Benign-appearing - Call for any changes  MELANOCYTIC NEVI - Tan-brown and/or pink-flesh-colored symmetric macules and papules - Benign appearing on exam today - Observation - Call  clinic for new or changing moles - Recommend daily use of broad spectrum spf 30+ sunscreen to sun-exposed areas.    INTERTRIGO Exam: Erythematous macerated patches in groin Chronic and persistent condition with duration or expected duration over one year. Condition is bothersome/symptomatic for patient. Currently flared. Intertrigo is a chronic recurrent rash that occurs in skin fold areas that may be associated with friction; heat; moisture; yeast; fungus; and bacteria.  It is exacerbated by increased movement / activity; sweating; and higher atmospheric temperature.  Use of an absorbant powder such as Zeasorb AF powder or other OTC antifungal powder to the area daily can prevent rash recurrence. Other options to help keep the area dry include blow drying the area after bathing or using antiperspirant products such as Duradry sweat minimizing gel. Treatment Plan: No recommended treatment as pt currently doing well and declines tx.  SCHAMBERG'S PIGMENTED PURPURA Exam: cayenne-pepper-like macules with associated golden-brown pigmentation of lower legs.  +/- lower leg edema Schamberg's purpura is a benign chronic condition that may also have intermittent episodes of worsening/improvement.  It is the most common type of pigmented purpura and is typically asymptomatic.  It generally affects older individuals, and may be related to leg swelling, increased activity, or excessive alcohol use.  Sometimes short term treatment with a mid-potency topical steroid is given for acute flares. Treatment Plan: Benign, observe.   Recommend daily graduated compression hose/stockings- easiest to put on first thing in morning, remove at bedtime.   SEBORRHEIC DERMATITIS Exam: Pink patches with greasy scale at left ear canal Chronic and persistent condition with duration or expected duration over one year. Condition is symptomatic/ bothersome to patient. Not currently at goal. Seborrheic Dermatitis is a chronic  persistent rash characterized by pinkness and scaling most commonly of the mid face but also can occur on the scalp (dandruff), ears; mid chest, mid back and groin.  It tends to be exacerbated by stress and cooler weather.  People who have neurologic disease may experience new onset or exacerbation of existing seborrheic dermatitis.  The condition is not curable but treatable and can be controlled. Treatment Plan: Start Dermotic oil - apply 1 to 2 drops to affected ear 3 days a week as needed. 1 refill Discussed may need to follow up with [facility] doctor if still bothersome   INFLAMED SEBORRHEIC KERATOSIS (26) chest / arms / face x 26 (26) Symptomatic, irritating, patient would like treated. Destruction of lesion - chest / arms / face x 26 (26) Complexity: simple   Destruction method: cryotherapy   Informed consent: discussed and consent obtained   Timeout:  patient name, date of birth, surgical site, and procedure verified Lesion destroyed using liquid nitrogen: Yes   Region frozen until ice ball extended beyond lesion: Yes   Outcome: patient tolerated procedure well with no complications   Post-procedure details: wound care instructions given    ACTINIC KERATOSIS (11) face x 11 (11) Actinic keratoses are precancerous spots that appear secondary to cumulative UV radiation exposure/sun exposure over time. They are chronic with expected duration over 1 year. A portion of actinic keratoses will progress to squamous cell carcinoma of the skin. It is not possible to reliably predict which spots will progress to skin cancer and  so treatment is recommended to prevent development of skin cancer.  Recommend daily broad spectrum sunscreen SPF 30+ to sun-exposed areas, reapply every 2 hours as needed.  Recommend staying in the shade or wearing long sleeves, sun glasses (UVA+UVB protection) and wide brim hats (4-inch brim around the entire circumference of the hat). Call for new or changing  lesions. Destruction of lesion - face x 11 (11) Complexity: simple   Destruction method: cryotherapy   Informed consent: discussed and consent obtained   Timeout:  patient name, date of birth, surgical site, and procedure verified Lesion destroyed using liquid nitrogen: Yes   Region frozen until ice ball extended beyond lesion: Yes   Outcome: patient tolerated procedure well with no complications   Post-procedure details: wound care instructions given    SEBORRHEIC DERMATITIS   Related Medications Fluocinolone Acetonide (DERMOTIC) 0.01 % OIL Apply 1 to 2 drops topically to affected ear canal 3 days a week as needed for seborrheic dermatitis Return for 8 month tbse.  IEleanor Blush, CMA, am acting as scribe for Alm Rhyme, MD.   Documentation: I have reviewed the above documentation for accuracy and completeness, and I agree with the above.  Alm Rhyme, MD

## 2024-04-04 NOTE — Patient Instructions (Addendum)
 Seborrheic Keratosis  What causes seborrheic keratoses? Seborrheic keratoses are harmless, common skin growths that first appear during adult life.  As time goes by, more growths appear.  Some people may develop a large number of them.  Seborrheic keratoses appear on both covered and uncovered body parts.  They are not caused by sunlight.  The tendency to develop seborrheic keratoses can be inherited.  They vary in color from skin-colored to gray, brown, or even black.  They can be either smooth or have a rough, warty surface.   Seborrheic keratoses are superficial and look as if they were stuck on the skin.  Under the microscope this type of keratosis looks like layers upon layers of skin.  That is why at times the top layer may seem to fall off, but the rest of the growth remains and re-grows.    Treatment Seborrheic keratoses do not need to be treated, but can easily be removed in the office.  Seborrheic keratoses often cause symptoms when they rub on clothing or jewelry.  Lesions can be in the way of shaving.  If they become inflamed, they can cause itching, soreness, or burning.  Removal of a seborrheic keratosis can be accomplished by freezing, burning, or surgery. If any spot bleeds, scabs, or grows rapidly, please return to have it checked, as these can be an indication of a skin cancer.   Cryotherapy Aftercare  Wash gently with soap and water everyday.   Apply Vaseline and Band-Aid daily until healed.    Actinic keratoses are precancerous spots that appear secondary to cumulative UV radiation exposure/sun exposure over time. They are chronic with expected duration over 1 year. A portion of actinic keratoses will progress to squamous cell carcinoma of the skin. It is not possible to reliably predict which spots will progress to skin cancer and so treatment is recommended to prevent development of skin cancer.  Recommend daily broad spectrum sunscreen SPF 30+ to sun-exposed areas, reapply  every 2 hours as needed.  Recommend staying in the shade or wearing long sleeves, sun glasses (UVA+UVB protection) and wide brim hats (4-inch brim around the entire circumference of the hat). Call for new or changing lesions.     Melanoma ABCDEs  Melanoma is the most dangerous type of skin cancer, and is the leading cause of death from skin disease.  You are more likely to develop melanoma if you: Have light-colored skin, light-colored eyes, or red or blond hair Spend a lot of time in the sun Tan regularly, either outdoors or in a tanning bed Have had blistering sunburns, especially during childhood Have a close family member who has had a melanoma Have atypical moles or large birthmarks  Early detection of melanoma is key since treatment is typically straightforward and cure rates are extremely high if we catch it early.   The first sign of melanoma is often a change in a mole or a new dark spot.  The ABCDE system is a way of remembering the signs of melanoma.  A for asymmetry:  The two halves do not match. B for border:  The edges of the growth are irregular. C for color:  A mixture of colors are present instead of an even brown color. D for diameter:  Melanomas are usually (but not always) greater than 6mm - the size of a pencil eraser. E for evolution:  The spot keeps changing in size, shape, and color.  Please check your skin once per month between visits. You can  use a small mirror in front and a large mirror behind you to keep an eye on the back side or your body.   If you see any new or changing lesions before your next follow-up, please call to schedule a visit.  Please continue daily skin protection including broad spectrum sunscreen SPF 30+ to sun-exposed areas, reapplying every 2 hours as needed when you're outdoors.   Staying in the shade or wearing long sleeves, sun glasses (UVA+UVB protection) and wide brim hats (4-inch brim around the entire circumference of the hat)  are also recommended for sun protection.    Due to recent changes in healthcare laws, you may see results of your pathology and/or laboratory studies on MyChart before the doctors have had a chance to review them. We understand that in some cases there may be results that are confusing or concerning to you. Please understand that not all results are received at the same time and often the doctors may need to interpret multiple results in order to provide you with the best plan of care or course of treatment. Therefore, we ask that you please give us  2 business days to thoroughly review all your results before contacting the office for clarification. Should we see a critical lab result, you will be contacted sooner.   If You Need Anything After Your Visit  If you have any questions or concerns for your doctor, please call our main line at [phone] and press option 4 to reach your doctor's medical assistant. If no one answers, please leave a voicemail as directed and we will return your call as soon as possible. Messages left after 4 pm will be answered the following business day.   You may also send us  a message via MyChart. We typically respond to MyChart messages within 1-2 business days.  For prescription refills, please ask your pharmacy to contact our office. Our fax number is [phone].  If you have an urgent issue when the clinic is closed that cannot wait until the next business day, you can page your doctor at the number below.    Please note that while we do our best to be available for urgent issues outside of office hours, we are not available 24/7.   If you have an urgent issue and are unable to reach us , you may choose to seek medical care at your doctor's office, retail clinic, urgent care center, or emergency room.  If you have a medical emergency, please immediately call 911 or go to the emergency department.  Pager Numbers  - Dr. Hester: [phone]  - Dr. Jackquline:  [phone]  - Dr. Claudene: [phone]   - Dr. Raymund: [phone]  In the event of inclement weather, please call our main line at [phone] for an update on the status of any delays or closures.  Dermatology Medication Tips: Please keep the boxes that topical medications come in in order to help keep track of the instructions about where and how to use these. Pharmacies typically print the medication instructions only on the boxes and not directly on the medication tubes.   If your medication is too expensive, please contact our office at [phone] option 4 or send us  a message through MyChart.   We are unable to tell what your co-pay for medications will be in advance as this is different depending on your insurance coverage. However, we may be able to find a substitute medication at lower cost or fill out paperwork to get insurance to cover  a needed medication.   If a prior authorization is required to get your medication covered by your insurance company, please allow us  1-2 business days to complete this process.  Drug prices often vary depending on where the prescription is filled and some pharmacies may offer cheaper prices.  The website www.goodrx.com contains coupons for medications through different pharmacies. The prices here do not account for what the cost may be with help from insurance (it may be cheaper with your insurance), but the website can give you the price if you did not use any insurance.  - You can print the associated coupon and take it with your prescription to the pharmacy.  - You may also stop by our office during regular business hours and pick up a GoodRx coupon card.  - If you need your prescription sent electronically to a different pharmacy, notify our office through [facility] or by phone at [phone] option 4.     Si Usted Necesita Algo Despus de Su Visita  Tambin puede enviarnos un mensaje a travs de Clinical cytogeneticist. Por lo general  respondemos a los mensajes de MyChart en el transcurso de 1 a 2 das hbiles.  Para renovar recetas, por favor pida a su farmacia que se ponga en contacto con nuestra oficina. Randi lakes de fax es [place] [phone].  Si tiene un asunto urgente cuando la clnica est cerrada y que no puede esperar hasta el siguiente da hbil, puede llamar/localizar a su doctor(a) al nmero que aparece a continuacin.   Por favor, tenga en cuenta que aunque hacemos todo lo posible para estar disponibles para asuntos urgentes fuera del horario de [place], no estamos disponibles las 24 horas del da, los 7 [address]  [address] de la [place].   Si tiene un problema urgente y no puede comunicarse con nosotros, puede optar por buscar atencin mdica  en el consultorio de su doctor(a), en una clnica privada, en un centro de atencin urgente o en una sala de emergencias.  Si tiene Engineer, drilling, por favor llame inmediatamente al 911 o vaya a la sala de emergencias.  Nmeros de bper  - Dr. Hester: [phone]  - Dra. Jackquline: [phone]  - Dr. Claudene: [phone]  - Dra. Kitts: [phone]  En caso de inclemencias del [place], por favor llame a nuestra lnea principal al [phone] para una actualizacin sobre el estado de cualquier retraso o cierre.  Consejos para la medicacin en dermatologa: Por favor, guarde las cajas en las que vienen los medicamentos de uso tpico para ayudarle a seguir las instrucciones sobre dnde y cmo usarlos. Las farmacias generalmente imprimen las instrucciones del medicamento slo en las cajas y no directamente en los tubos del [place].   Si su medicamento es muy caro, por favor, pngase en contacto con landry rieger llamando al [phone] y presione la opcin 4 o envenos un mensaje a travs de Clinical cytogeneticist.   No podemos decirle cul ser su copago por los medicamentos por adelantado ya que esto es diferente dependiendo de la cobertura de su seguro. Sin embargo, es posible  que podamos encontrar un medicamento sustituto a Audiological scientist un formulario para que el seguro cubra el medicamento que se considera necesario.   Si se requiere una autorizacin previa para que su compaa de seguros malta su medicamento, por favor permtanos de 1 a 2 das hbiles para completar este proceso.  Los precios de los medicamentos varan con frecuencia dependiendo del Environmental consultant de dnde se surte la receta y iraq  pueden ofrecer precios ms baratos.  El sitio web www.goodrx.com tiene cupones para medicamentos de Health and safety inspector. Los precios aqu no tienen en cuenta lo que podra costar con la ayuda del seguro (puede ser ms barato con su seguro), pero el sitio web puede darle el precio si no utiliz Tourist information centre manager.  - Puede imprimir el cupn correspondiente y llevarlo con su receta a la farmacia.  - Tambin puede pasar por nuestra oficina durante el horario de atencin regular y Education officer, museum una tarjeta de cupones de GoodRx.  - Si necesita que su receta se enve electrnicamente a una farmacia diferente, informe a nuestra oficina a travs de MyChart de [place] o por telfono llamando al [phone] y presione la opcin 4.

## 2024-04-07 ENCOUNTER — Ambulatory Visit: Payer: Medicare Other | Admitting: Dermatology

## 2024-04-12 ENCOUNTER — Encounter: Payer: Self-pay | Admitting: Dermatology

## 2024-11-14 ENCOUNTER — Ambulatory Visit: Admitting: Dermatology
